# Patient Record
Sex: Male | Born: 1970 | Race: Black or African American | Hispanic: No | Marital: Married | Smoking: Former smoker
Health system: Southern US, Community
[De-identification: ages and names within clinical notes are randomized; demographics above are authoritative.]

## PROBLEM LIST (undated history)

## (undated) DIAGNOSIS — R7303 Prediabetes: Secondary | ICD-10-CM

## (undated) DIAGNOSIS — M1990 Unspecified osteoarthritis, unspecified site: Secondary | ICD-10-CM

## (undated) DIAGNOSIS — T7840XA Allergy, unspecified, initial encounter: Secondary | ICD-10-CM

## (undated) HISTORY — DX: Allergy, unspecified, initial encounter: T78.40XA

---

## 2003-11-21 HISTORY — PX: KNEE ARTHROSCOPY W/ PARTIAL MEDIAL MENISCECTOMY: SHX1882

## 2009-10-08 ENCOUNTER — Ambulatory Visit: Payer: Self-pay | Admitting: Family Medicine

## 2009-10-08 DIAGNOSIS — J45909 Unspecified asthma, uncomplicated: Secondary | ICD-10-CM | POA: Insufficient documentation

## 2009-10-08 DIAGNOSIS — L308 Other specified dermatitis: Secondary | ICD-10-CM | POA: Insufficient documentation

## 2009-10-08 DIAGNOSIS — J309 Allergic rhinitis, unspecified: Secondary | ICD-10-CM | POA: Insufficient documentation

## 2009-10-12 LAB — CONVERTED CEMR LAB
AST: 33 units/L (ref 0–37)
BUN: 14 mg/dL (ref 6–23)
CO2: 31 meq/L (ref 19–32)
Chloride: 100 meq/L (ref 96–112)
Creatinine, Ser: 1 mg/dL (ref 0.4–1.5)
Direct LDL: 139.8 mg/dL
Total Bilirubin: 1.5 mg/dL — ABNORMAL HIGH (ref 0.3–1.2)
Total CHOL/HDL Ratio: 5
Triglycerides: 243 mg/dL — ABNORMAL HIGH (ref 0.0–149.0)

## 2009-10-28 ENCOUNTER — Encounter: Payer: Self-pay | Admitting: Family Medicine

## 2010-10-10 ENCOUNTER — Telehealth (INDEPENDENT_AMBULATORY_CARE_PROVIDER_SITE_OTHER): Payer: Self-pay | Admitting: *Deleted

## 2010-10-18 ENCOUNTER — Ambulatory Visit: Payer: Self-pay | Admitting: Family Medicine

## 2010-10-21 LAB — CONVERTED CEMR LAB
CO2: 32 meq/L (ref 19–32)
Chloride: 102 meq/L (ref 96–112)
Creatinine, Ser: 1.1 mg/dL (ref 0.4–1.5)
Direct LDL: 125.7 mg/dL
HDL: 42.8 mg/dL (ref >39.00)
Total CHOL/HDL Ratio: 6
Triglycerides: 371 mg/dL — ABNORMAL HIGH (ref 0.0–149.0)
VLDL: 74.2 mg/dL — ABNORMAL HIGH (ref 0.0–40.0)

## 2010-10-25 ENCOUNTER — Ambulatory Visit: Payer: Self-pay | Admitting: Family Medicine

## 2010-10-25 DIAGNOSIS — E781 Pure hyperglyceridemia: Secondary | ICD-10-CM | POA: Insufficient documentation

## 2010-12-20 NOTE — Progress Notes (Signed)
----   Converted from flag ---- ---- 10/07/2010 12:59 PM, Ruthe Mannan MD wrote: BMET (v70.0), fasting lipid panel (v81.0)  ---- 10/07/2010 12:19 PM, Liane Comber CMA (AAMA) wrote: Lab orders please! Good Morning! This pt is scheduled for cpx labs Tuesday, which labs to draw and dx codes to use? Thanks Tasha ------------------------------

## 2010-12-20 NOTE — Assessment & Plan Note (Signed)
Summary: CPX/CLE  R/S FROM 10/20/10,10/19/10   Vital Signs:  Patient profile:   40 year old male Height:      70 inches Weight:      234.50 pounds BMI:     33.77 Temp:     98.8 degrees F oral Pulse rate:   80 / minute Pulse rhythm:   regular BP sitting:   130 / 90  (left arm) Cuff size:   large  Vitals Entered By: Linde Gillis CMA Duncan Dull) (October 25, 2010 8:00 AM) CC: complete physical   History of Present Illness: 40 yo pt here for CPX.  Doing very well.  Just got engaged and bought a new house!  Allergies/asthma- had severe allergies and asthma when he lived in a different apartment in [place] ( alot of mold), had to use daily advair and daily rescue inhaler.  Since he moved out of that home in 2005, has not used either inhaler at all.  No cough, no shortness of breath, no wheezing.   Elevated TG- TG 371 ( was 243 last year), otherwise lipid panel within normal limits for his age and risk factors. Admits to drinking more ETOH in last two months- his dad was livign with them.  Also a little less physically active. Wants to loose some weight as well.  Current Medications (verified): 1)  None  Allergies (verified): No Known Drug Allergies  Past History:  Past Medical History: Last updated: 10/08/2009 Allergic rhinitis Asthma  Past Surgical History: Last updated: 10/08/2009 left knee meniscal repair in 2005  Family History: Last updated: 10/08/2009 Mom - DM, diabetic retinopathy, HTN Dad- DM, HTN, CHF 4 healthy sisters.  Social History: Last updated: 10/08/2009 LIves in [place] with Girlfriend and 2 dogs.  works as an Acupuncturist.  Likes golf, basketball. Never Smoked Alcohol use-yes Drug use-no Regular exercise-yes  Risk Factors: Exercise: yes (10/08/2009)  Risk Factors: Smoking Status: never (10/08/2009)  Review of Systems      See HPI General:  Denies malaise. Eyes:  Denies blurring. ENT:  Denies difficulty swallowing. CV:  Denies  chest pain or discomfort. Resp:  Denies shortness of breath. GI:  Denies abdominal pain. GU:  Denies dysuria. MS:  Denies joint pain, joint redness, and joint swelling. Derm:  Denies rash. Neuro:  Denies headaches. Psych:  Denies anxiety and depression. Endo:  Denies cold intolerance and heat intolerance. Heme:  Denies abnormal bruising and bleeding.  Physical Exam  General:  Well-developed,well-nourished,in no acute distress; alert,appropriate and cooperative throughout examination Head:  normocephalic and atraumatic.   Eyes:  vision grossly intact, pupils equal, and pupils round.   Ears:  R ear normal and L ear normal.   Nose:  no external deformity.   Mouth:  good dentition.   Neck:  No deformities, masses, or tenderness noted. Lungs:  Normal respiratory effort, chest expands symmetrically. Lungs are clear to auscultation, no crackles or wheezes. Heart:  Normal rate and regular rhythm. S1 and S2 normal without gallop, murmur, click, rub or other extra sounds. Abdomen:  Bowel sounds positive,abdomen soft and non-tender without masses, organomegaly or hernias noted. Msk:  No deformity or scoliosis noted of thoracic or lumbar spine.   Extremities:  No clubbing, cyanosis, edema, or deformity noted with normal full range of motion of all joints.   Neurologic:  alert & oriented X3 and gait normal.   Skin:  very dry skin on hands, arms,  and legs, cracked, no erythema but improved from last year. Cervical Nodes:  No lymphadenopathy noted Psych:  normally interactive, good eye contact, not anxious appearing, and not depressed appearing.     Impression & Recommendations:  Problem # 1:  Preventive Health Care (ICD-V70.0) Reviewed preventive care protocols, scheduled due services, and updated immunizations Discussed nutrition, exercise, diet, and healthy lifestyle.  Problem # 2:  HYPERTRIGLYCERIDEMIA (ICD-272.1) Assessment: Deteriorated Will try lifestyle changes (see pt instructions).   He is aware that if it does not decrease in 8-12 weeks, we have to add medication.  Patient Instructions: 1)  Triglycerides are too high.  Weight loss (even small amount) can decrease triglycerides.  Decrease added sugars, eliminate trans fats, increase fiber and limit alcohol.  All these changes together can drop triglycerides by almost 50%. 2)  Let's recheck your fasting lipid panel in 8 -12 weeks- (272.4) 3)  Congratulations!   Orders Added: 1)  Est. Patient 18-39 years [99395]    Prior Medications (reviewed today): None Current Allergies (reviewed today): No known allergies

## 2010-12-23 ENCOUNTER — Other Ambulatory Visit: Payer: Self-pay | Admitting: Family Medicine

## 2010-12-23 ENCOUNTER — Encounter (INDEPENDENT_AMBULATORY_CARE_PROVIDER_SITE_OTHER): Payer: Self-pay | Admitting: *Deleted

## 2010-12-23 ENCOUNTER — Other Ambulatory Visit (INDEPENDENT_AMBULATORY_CARE_PROVIDER_SITE_OTHER): Payer: Managed Care, Other (non HMO)

## 2010-12-23 ENCOUNTER — Ambulatory Visit: Admit: 2010-12-23 | Payer: Self-pay | Admitting: Family Medicine

## 2010-12-23 DIAGNOSIS — E781 Pure hyperglyceridemia: Secondary | ICD-10-CM

## 2010-12-23 DIAGNOSIS — E785 Hyperlipidemia, unspecified: Secondary | ICD-10-CM

## 2011-02-13 ENCOUNTER — Other Ambulatory Visit: Payer: Self-pay | Admitting: Family Medicine

## 2011-02-13 DIAGNOSIS — E785 Hyperlipidemia, unspecified: Secondary | ICD-10-CM

## 2011-02-14 ENCOUNTER — Other Ambulatory Visit: Payer: Managed Care, Other (non HMO)

## 2011-02-17 ENCOUNTER — Other Ambulatory Visit (INDEPENDENT_AMBULATORY_CARE_PROVIDER_SITE_OTHER): Payer: Managed Care, Other (non HMO) | Admitting: Family Medicine

## 2011-02-17 DIAGNOSIS — E785 Hyperlipidemia, unspecified: Secondary | ICD-10-CM

## 2011-02-17 LAB — HEPATIC FUNCTION PANEL: Albumin: 4.3 g/dL (ref 3.5–5.2)

## 2011-02-17 LAB — LIPID PANEL
Cholesterol: 200 mg/dL (ref 0–200)
HDL: 42.6 mg/dL (ref >39.00)
Total CHOL/HDL Ratio: 5
Triglycerides: 265 mg/dL — ABNORMAL HIGH (ref 0.0–149.0)
VLDL: 53 mg/dL — ABNORMAL HIGH (ref 0.0–40.0)

## 2011-02-17 LAB — LDL CHOLESTEROL, DIRECT: Direct LDL: 108.9 mg/dL

## 2011-06-08 ENCOUNTER — Encounter: Payer: Self-pay | Admitting: Family Medicine

## 2011-06-09 ENCOUNTER — Ambulatory Visit (INDEPENDENT_AMBULATORY_CARE_PROVIDER_SITE_OTHER): Payer: Managed Care, Other (non HMO) | Admitting: Family Medicine

## 2011-06-09 ENCOUNTER — Encounter: Payer: Self-pay | Admitting: Family Medicine

## 2011-06-09 VITALS — BP 120/70 | HR 77 | Temp 98.6°F | Ht 70.5 in | Wt 229.8 lb

## 2011-06-09 DIAGNOSIS — J45909 Unspecified asthma, uncomplicated: Secondary | ICD-10-CM

## 2011-06-09 MED ORDER — FLUTICASONE-SALMETEROL 250-50 MCG/DOSE IN AEPB: 1.0000 | INHALATION_SPRAY | Freq: Two times a day (BID) | RESPIRATORY_TRACT | Status: DC

## 2011-06-09 MED ORDER — ALBUTEROL SULFATE HFA 108 (90 BASE) MCG/ACT IN AERS: 2.0000 | INHALATION_SPRAY | Freq: Four times a day (QID) | RESPIRATORY_TRACT | Status: DC | PRN

## 2011-06-09 MED ORDER — MONTELUKAST SODIUM 10 MG PO TABS: 10.0000 mg | ORAL_TABLET | Freq: Every day | ORAL | Status: DC

## 2011-06-09 NOTE — Progress Notes (Signed)
40 yo here for worsening asthma symptoms.  Allergies/asthma- had severe allergies and asthma when he lived in a different apartment in [place] ( alot of mold), had to use daily advair and daily rescue inhaler. Since he moved out of that home in 2005, has not used either inhaler at all and was fine until a couple of months ago  With hot weather and humidity, increased cough and wheezing.  Some mild nasal congestion.  No longer has any prescriptions for inhalers.   No CP, no SOB.     Patient Active Problem List  Diagnoses  . HYPERTRIGLYCERIDEMIA  . ALLERGIC RHINITIS  . ASTHMA  . ASTEATOTIC ECZEMA   Past Medical History  Diagnosis Date  . Allergy   . Asthma    Past Surgical History  Procedure Date  . Knee arthroscopy w/ meniscal repair 2005    left    History  Substance Use Topics  . Smoking status: Never Smoker   . Smokeless tobacco: Not on file  . Alcohol Use: Yes   Family History  Problem Relation Age of Onset  . Diabetes Mother   . Diabetes Father   . Hypertension Father     The PMH, PSH, Social History, Family History, Medications, and allergies have been reviewed in [facility], and have been updated if relevant.  Review of Systems  See HPI  General: Denies malaise.  Eyes: Denies blurring.  ENT: Denies difficulty swallowing.  CV: Denies chest pain or discomfort.  GU: Denies dysuria.  MS: Denies joint pain, joint redness, and joint swelling.  Derm: Denies rash.  Neuro: Denies headaches.  Psych: Denies anxiety and depression.  Endo: Denies cold intolerance and heat intolerance.  Heme: Denies abnormal bruising and bleeding.   Physical Exam  BP 120/70  Pulse 77  Temp(Src) 98.6 F (37 C) (Oral)  Ht 5' 10.5" (1.791 m)  Wt 229 lb 12.8 oz (104.237 kg)  BMI 32.51 kg/m2  SpO2 99%  General: Well-developed,well-nourished,in no acute distress; alert,appropriate and cooperative throughout examination  Head: normocephalic and atraumatic.  Eyes: vision grossly  intact, pupils equal, and pupils round.  Ears: R ear normal and L ear normal.  Nose: no external deformity, pos mild erythema.  Mouth: good dentition.  Neck: No deformities, masses, or tenderness noted.  Lungs: Normal respiratory effort, chest expands symmetrically.  Faint exp wheezes, bilaterally  Heart: Normal rate and regular rhythm. S1 and S2 normal without gallop, murmur, click, rub or other extra sounds.  Abdomen: Bowel sounds positive,abdomen soft and non-tender without masses, organomegaly or hernias noted.  Msk: No deformity or scoliosis noted of thoracic or lumbar spine.  Extremities: No clubbing, cyanosis, edema, or deformity noted with normal full range of motion of all joints.  Neurologic: alert & oriented X3 and gait normal.  Skin: very dry skin on hands, arms, and legs, cracked, no erythema but improved from last year.  Cervical Nodes: No lymphadenopathy noted  Psych: normally interactive, good eye contact, not anxious appearing, and not depressed appearing.   Assessment and Plan: 1. ASTHMA   Deteriorated. Will start Advair today, albuterol as needed. Add Singulair as well. The patient indicates understanding of these issues and agrees with the plan.

## 2011-08-31 ENCOUNTER — Other Ambulatory Visit: Payer: Self-pay | Admitting: Family Medicine

## 2011-09-18 ENCOUNTER — Other Ambulatory Visit: Payer: Self-pay | Admitting: Family Medicine

## 2011-11-06 ENCOUNTER — Telehealth: Payer: Self-pay | Admitting: *Deleted

## 2011-11-06 NOTE — Telephone Encounter (Signed)
Pt request Tetanus booster to go back to school at [employer]. Please advise.

## 2011-11-07 NOTE — Telephone Encounter (Signed)
Left message on cell phone voicemail advising patient to call back to schedule tetanus shot on nurse schedule.

## 2011-11-07 NOTE — Telephone Encounter (Signed)
Ok to schedule.

## 2011-11-15 ENCOUNTER — Ambulatory Visit: Payer: Managed Care, Other (non HMO)

## 2011-11-18 ENCOUNTER — Other Ambulatory Visit: Payer: Self-pay | Admitting: Family Medicine

## 2011-12-18 ENCOUNTER — Ambulatory Visit (INDEPENDENT_AMBULATORY_CARE_PROVIDER_SITE_OTHER)
Admission: RE | Admit: 2011-12-18 | Discharge: 2011-12-18 | Disposition: A | Discharge status: Home / Self Care | Payer: Managed Care, Other (non HMO) | Source: Ambulatory Visit | Attending: Family Medicine | Admitting: Family Medicine

## 2011-12-18 ENCOUNTER — Encounter: Payer: Self-pay | Admitting: Family Medicine

## 2011-12-18 ENCOUNTER — Ambulatory Visit (INDEPENDENT_AMBULATORY_CARE_PROVIDER_SITE_OTHER): Payer: Managed Care, Other (non HMO) | Admitting: Family Medicine

## 2011-12-18 VITALS — BP 122/74 | HR 85 | Temp 99.0°F | Ht 70.5 in | Wt 233.0 lb

## 2011-12-18 DIAGNOSIS — M25562 Pain in left knee: Secondary | ICD-10-CM

## 2011-12-18 DIAGNOSIS — M25569 Pain in unspecified knee: Secondary | ICD-10-CM

## 2011-12-18 NOTE — Patient Instructions (Signed)
[email or web address]  Google "[facility]" Videos

## 2011-12-18 NOTE — Progress Notes (Signed)
  Patient Name: Bradley Carroll Date of Birth: Feb 24, 1971 Age: 41 y.o. Medical Record Number: [id number] Gender: male Date of Encounter: 12/18/2011  History of Present Illness:  Bradley Carroll is a 41 y.o. very pleasant male patient who presents with the following:  2005 - Injured left knee and played some soccer. Tore meniscaus, and had a scope Did some rehab, bending, squatting. After over, pain, mostly on L  Patient presents with multi-year h/o L sided knee pain after no acute injury. No audible pop was heard. The patient has had an effusion. No symptomatic giving-way. Occ mechanical clicking. Joint has not locked up. Patient has been able to walk without limping. The patient does have pain going up and down stairs or rising from a seated position.   Pain location: diffuse Current physical activity: none Prior Knee Surgery: L arthroscopy, partial menisectomy Current pain meds: prn motrin Bracing: none Occupation or school level: Acupuncturist  Past Medical History, Surgical History, Social History, Family History, Problem List, Medications, and Allergies have been reviewed and updated if relevant.  Review of Systems:  GEN: No fevers, chills. Nontoxic. Primarily MSK c/o today. MSK: Detailed in the HPI GI: tolerating PO intake without difficulty Neuro: No numbness, parasthesias, or tingling associated. Otherwise the pertinent positives of the ROS are noted above.    Physical Examination: Filed Vitals:   12/18/11 1636  BP: 122/74  Pulse: 85  Temp: 99 F (37.2 C)  TempSrc: Oral  Height: 5' 10.5" (1.791 m)  Weight: 233 lb (105.688 kg)  SpO2: 99%   Body mass index is 32.96 kg/(m^2).   GEN: WDWN, NAD, Non-toxic, Alert & Oriented x 3 HEENT: Atraumatic, Normocephalic.  Ears and Nose: No external deformity. EXTR: No clubbing/cyanosis/edema NEURO: Normal gait.  PSYCH: Normally interactive. Conversant. Not depressed or anxious appearing.  Calm demeanor.   Knee:  L Gait:  Normal heel toe pattern ROM: 0-125 Effusion: mild Echymosis or edema: none Patellar tendon NT Painful PLICA: neg Patellar grind: negative Medial and lateral patellar facet loading: negative Mild crepitus medial and lateral joint lines:NT Mcmurray's neg Flexion-pinch neg Varus and valgus stress: stable Lachman: neg Ant and Post drawer: neg Hip abduction, IR, ER: WNL Hip flexion str: 5/5 Hip abd: 5/5 Quad: 5/5 VMO atrophy:No Hamstring concentric and eccentric: 5/5     Assessment and Plan: 1. Left knee pain  DG Knee Bilateral Standing AP, DG Knee 1-2 Views Left    X-rays: AP Bilateral Weight-bearing, Weightbearing Lateral, Sunrise views Indication: knee pain Findings:  Mild spurring, osteophyte formation. No acute fracture or dislocation  >25 minutes spent in face to face time with patient, >50% spent in counselling or coordination of care: reviewed anatomy and rehab videos Some underlying OA with occ effusions. No loose bodies seen.  Plan: work on losing weight, conditioning - leg, core Prn nsaids

## 2012-02-06 ENCOUNTER — Other Ambulatory Visit: Payer: Self-pay | Admitting: Family Medicine

## 2012-03-18 ENCOUNTER — Other Ambulatory Visit: Payer: Self-pay | Admitting: Family Medicine

## 2012-07-06 ENCOUNTER — Other Ambulatory Visit: Payer: Self-pay | Admitting: Family Medicine

## 2012-07-08 ENCOUNTER — Other Ambulatory Visit: Payer: Self-pay | Admitting: *Deleted

## 2012-07-08 MED ORDER — MONTELUKAST SODIUM 10 MG PO TABS: ORAL_TABLET | ORAL | Status: DC

## 2012-07-08 NOTE — Telephone Encounter (Signed)
Patient not seen in over 1 year for cpx is it okay to refill

## 2012-07-19 ENCOUNTER — Other Ambulatory Visit: Payer: Self-pay | Admitting: Family Medicine

## 2012-10-21 ENCOUNTER — Telehealth: Payer: Self-pay

## 2012-10-21 MED ORDER — BUDESONIDE-FORMOTEROL FUMARATE 160-4.5 MCG/ACT IN AERO: 2.0000 | INHALATION_SPRAY | Freq: Two times a day (BID) | RESPIRATORY_TRACT | Status: DC

## 2012-10-21 NOTE — Telephone Encounter (Signed)
Advised patient new script has been sent in.

## 2012-10-21 NOTE — Telephone Encounter (Signed)
Pt called insurance coverage changed and Advair is no longer covered; pt request change Advair to either dulera or symbicort to [facility] [address]Please advise.

## 2012-10-21 NOTE — Telephone Encounter (Signed)
Symbicort rx sent.

## 2012-11-27 ENCOUNTER — Encounter: Payer: Self-pay | Admitting: Family Medicine

## 2012-11-27 ENCOUNTER — Ambulatory Visit (INDEPENDENT_AMBULATORY_CARE_PROVIDER_SITE_OTHER): Payer: Managed Care, Other (non HMO) | Admitting: Family Medicine

## 2012-11-27 VITALS — BP 120/88 | HR 88 | Temp 98.4°F | Wt 225.0 lb

## 2012-11-27 DIAGNOSIS — Z23 Encounter for immunization: Secondary | ICD-10-CM

## 2012-11-27 DIAGNOSIS — G4700 Insomnia, unspecified: Secondary | ICD-10-CM

## 2012-11-27 LAB — COMPREHENSIVE METABOLIC PANEL
AST: 34 U/L (ref 0–37)
Albumin: 4.9 g/dL (ref 3.5–5.2)
Alkaline Phosphatase: 58 U/L (ref 39–117)
Potassium: 4.2 mEq/L (ref 3.5–5.1)
Sodium: 138 mEq/L (ref 135–145)
Total Bilirubin: 1.6 mg/dL — ABNORMAL HIGH (ref 0.3–1.2)
Total Protein: 8.2 g/dL (ref 6.0–8.3)

## 2012-11-27 LAB — CBC WITH DIFFERENTIAL/PLATELET
Basophils Relative: 0.5 % (ref 0.0–3.0)
Eosinophils Absolute: 0.1 10*3/uL (ref 0.0–0.7)
HCT: 51.1 % (ref 39.0–52.0)
Lymphs Abs: 1.4 10*3/uL (ref 0.7–4.0)
MCHC: 34 g/dL (ref 30.0–36.0)
MCV: 87.9 fl (ref 78.0–100.0)
Monocytes Absolute: 0.3 10*3/uL (ref 0.1–1.0)
Neutrophils Relative %: 52.4 % (ref 43.0–77.0)
RBC: 5.82 Mil/uL — ABNORMAL HIGH (ref 4.22–5.81)

## 2012-11-27 MED ORDER — TRAZODONE HCL 50 MG PO TABS: 25.0000 mg | ORAL_TABLET | Freq: Every evening | ORAL | Status: DC | PRN

## 2012-11-27 NOTE — Progress Notes (Signed)
  Subjective:    Patient ID: Bradley Carroll, male    DOB: [birth year], 42 y.o.   MRN: [id number]  HPI  42 yo male here to discuss insomnia.  Wife is [redacted] weeks pregnant with their first child (boy).  Past two months, he is having difficulty falling asleep and having vivid dreams. He can stay asleep once he is asleep. Denies feeling overtly anxious.  Very excited about the baby's arrival.  Denies any depression.  Has not tried any OTC meds.  Does not watch TV in bed.  Tries not to eat too late.  Patient Active Problem List  Diagnosis  . HYPERTRIGLYCERIDEMIA  . ALLERGIC RHINITIS  . ASTHMA  . ASTEATOTIC ECZEMA  . Insomnia   Past Medical History  Diagnosis Date  . Allergy   . Asthma    Past Surgical History  Procedure Date  . Knee arthroscopy w/ partial medial meniscectomy 2005   History  Substance Use Topics  . Smoking status: Never Smoker   . Smokeless tobacco: Not on file  . Alcohol Use: Yes   Family History  Problem Relation Age of Onset  . Diabetes Mother   . Diabetes Father   . Hypertension Father    No Known Allergies Current Outpatient Prescriptions on File Prior to Visit  Medication Sig Dispense Refill  . ammonium lactate (AMLACTIN) 12 % cream       . budesonide-formoterol (SYMBICORT) 160-4.5 MCG/ACT inhaler Inhale 2 puffs into the lungs 2 (two) times daily.  1 Inhaler  3  . montelukast (SINGULAIR) 10 MG tablet TAKE 1 TABLET AT BEDTIME  30 tablet  2  . PROAIR HFA 108 (90 BASE) MCG/ACT inhaler INHALE 2 PUFFS INTO THE LUNGS EVERY 6 (SIX) HOURS AS NEEDED FOR WHEEZING.  8.5 g  2  . traZODone (DESYREL) 50 MG tablet Take 0.5-1 tablets (25-50 mg total) by mouth at bedtime as needed for sleep.  30 tablet  3   The PMH, PSH, Social History, Family History, Medications, and allergies have been reviewed in [facility], and have been updated if relevant.   Review of Systems See HPI    Objective:   Physical Exam BP 120/88  Pulse 88  Temp 98.4 F (36.9 C)  Wt 225 lb  (102.059 kg) Gen:  Alert, pleasant, NAD Psych:  Good eye contact, not anxious or depressed appearing.    Assessment & Plan:   1. Insomnia  Comprehensive metabolic panel, CBC with Differential, TSH   >15 min spent with face to face with patient, >50% counseling and/or coordinating care.  Insomnia likely situational due to anticipation of baby's arrival. Will check labs to rule out any other contributing factors.  Start with OTC meds- see patient instructions.  Has good sleep hygeine. Given rx for trazodone to fill if symptoms do not improve.

## 2012-11-27 NOTE — Addendum Note (Signed)
Addended by: Eliezer Bottom on: 11/27/2012 10:57 AM   Modules accepted: Orders

## 2012-11-27 NOTE — Patient Instructions (Addendum)
Good to see you. Can't wait to meet little EDL4.  Let's try the over the counter meds first:  - benadryl - 1 tablet nightly OR tylenol PM  - melatonin  If after a few nights, this is working, go ahead and start the Trazodone.

## 2012-11-28 ENCOUNTER — Other Ambulatory Visit: Payer: Self-pay | Admitting: Family Medicine

## 2012-11-28 DIAGNOSIS — R7989 Other specified abnormal findings of blood chemistry: Secondary | ICD-10-CM

## 2012-12-03 ENCOUNTER — Other Ambulatory Visit: Payer: Managed Care, Other (non HMO)

## 2013-01-15 ENCOUNTER — Other Ambulatory Visit (INDEPENDENT_AMBULATORY_CARE_PROVIDER_SITE_OTHER): Payer: Managed Care, Other (non HMO)

## 2013-01-15 DIAGNOSIS — R946 Abnormal results of thyroid function studies: Secondary | ICD-10-CM

## 2013-01-15 DIAGNOSIS — R7989 Other specified abnormal findings of blood chemistry: Secondary | ICD-10-CM

## 2013-01-15 LAB — CBC WITH DIFFERENTIAL/PLATELET
Basophils Absolute: 0 10*3/uL (ref 0.0–0.1)
Eosinophils Absolute: 0.1 10*3/uL (ref 0.0–0.7)
Eosinophils Relative: 1.8 % (ref 0.0–5.0)
HCT: 46.8 % (ref 39.0–52.0)
Lymphs Abs: 1.5 10*3/uL (ref 0.7–4.0)
MCHC: 34.2 g/dL (ref 30.0–36.0)
MCV: 86.8 fl (ref 78.0–100.0)
Monocytes Absolute: 0.3 10*3/uL (ref 0.1–1.0)
Neutrophils Relative %: 56 % (ref 43.0–77.0)
Platelets: 230 10*3/uL (ref 150.0–400.0)
RDW: 13.4 % (ref 11.5–14.6)
WBC: 4.3 10*3/uL — ABNORMAL LOW (ref 4.5–10.5)

## 2013-01-15 LAB — TSH: TSH: 0.38 u[IU]/mL (ref 0.35–5.50)

## 2013-01-16 LAB — PATHOLOGIST SMEAR REVIEW

## 2013-01-21 ENCOUNTER — Encounter: Payer: Managed Care, Other (non HMO) | Admitting: Family Medicine

## 2013-01-28 ENCOUNTER — Ambulatory Visit: Payer: Managed Care, Other (non HMO) | Admitting: Family Medicine

## 2013-01-28 NOTE — Progress Notes (Deleted)
Previous result note error. Will discuss with pt at office visit.

## 2013-01-28 NOTE — Progress Notes (Signed)
No show

## 2013-02-18 IMAGING — CR DG KNEE 1-2V*L*
4 series · 4 of 4 positions shown · non-contrast
Comparison: None available.

CLINICAL DATA: Left knee pain.  719.46.

LEFT KNEE - 1-2 VIEW

[view not recorded (1 of 4)]
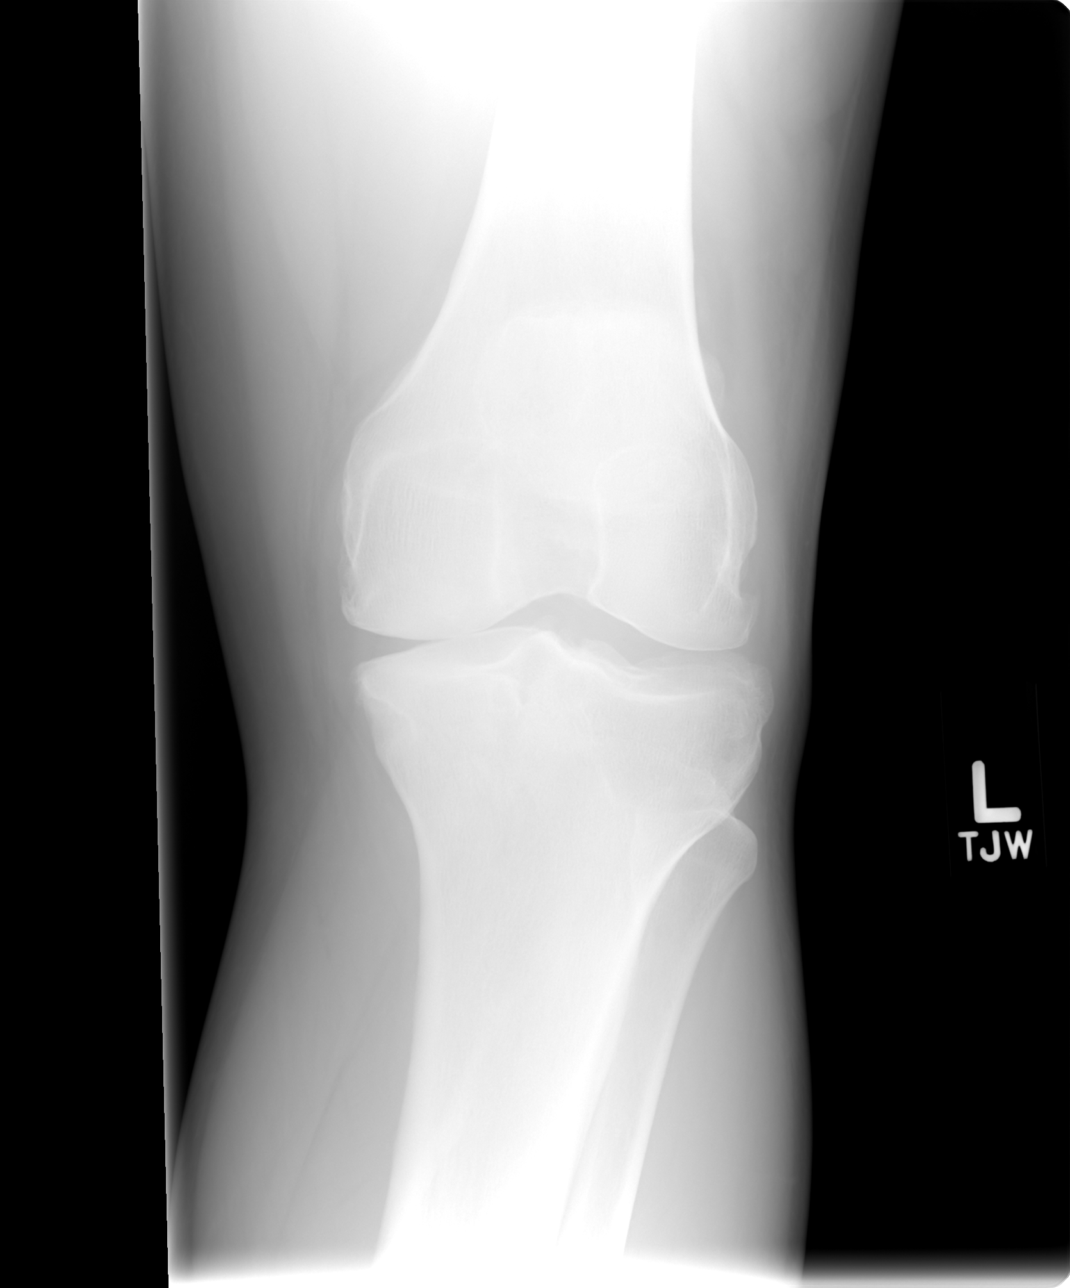

[view not recorded (2 of 4)]
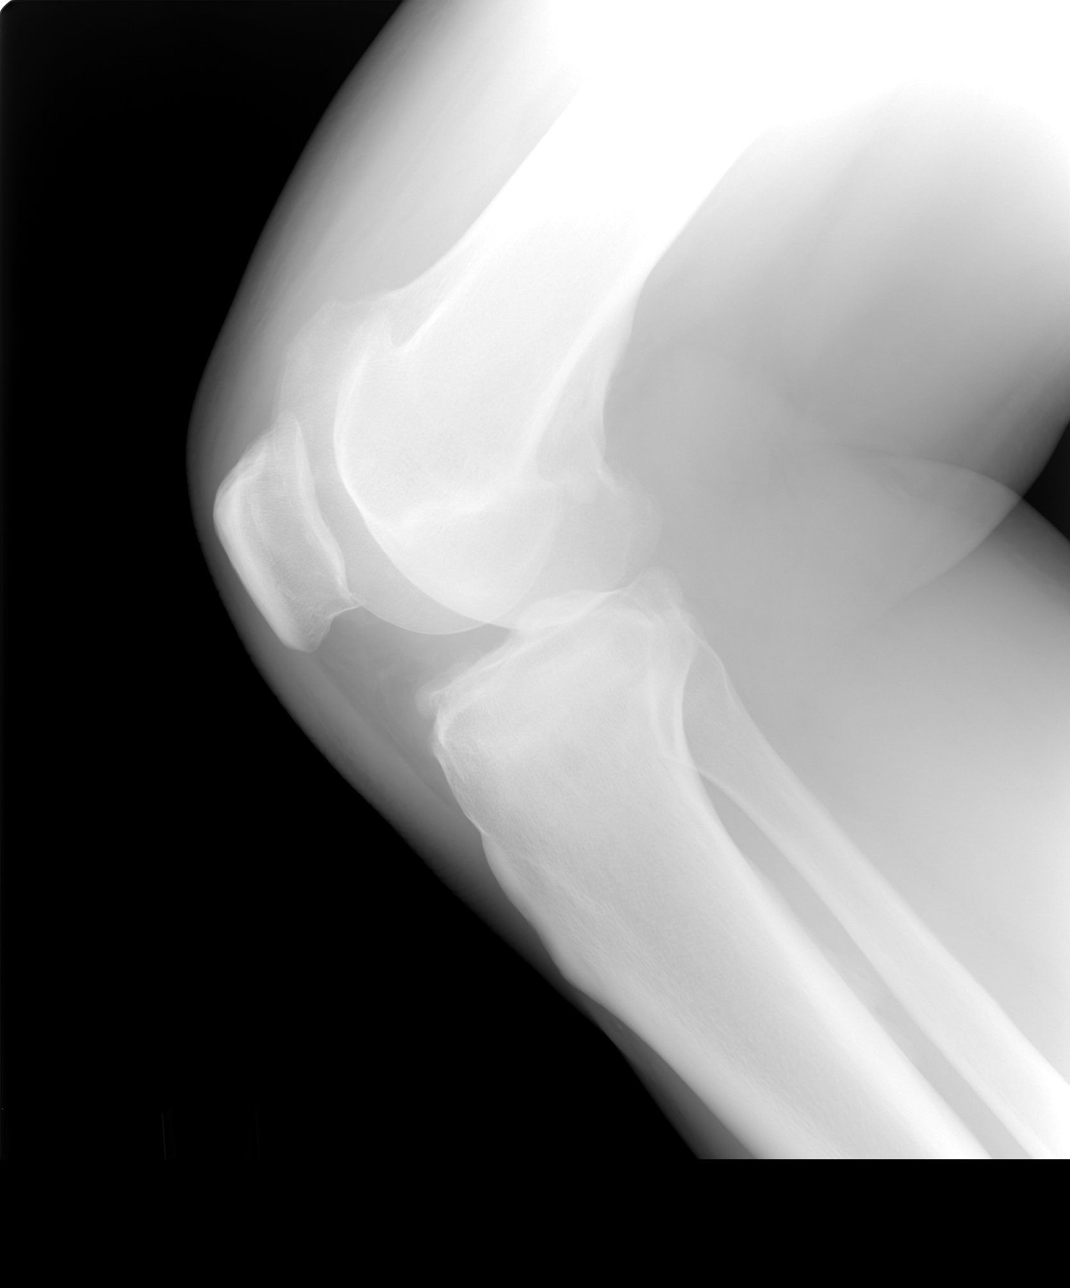

[view not recorded (3 of 4)]
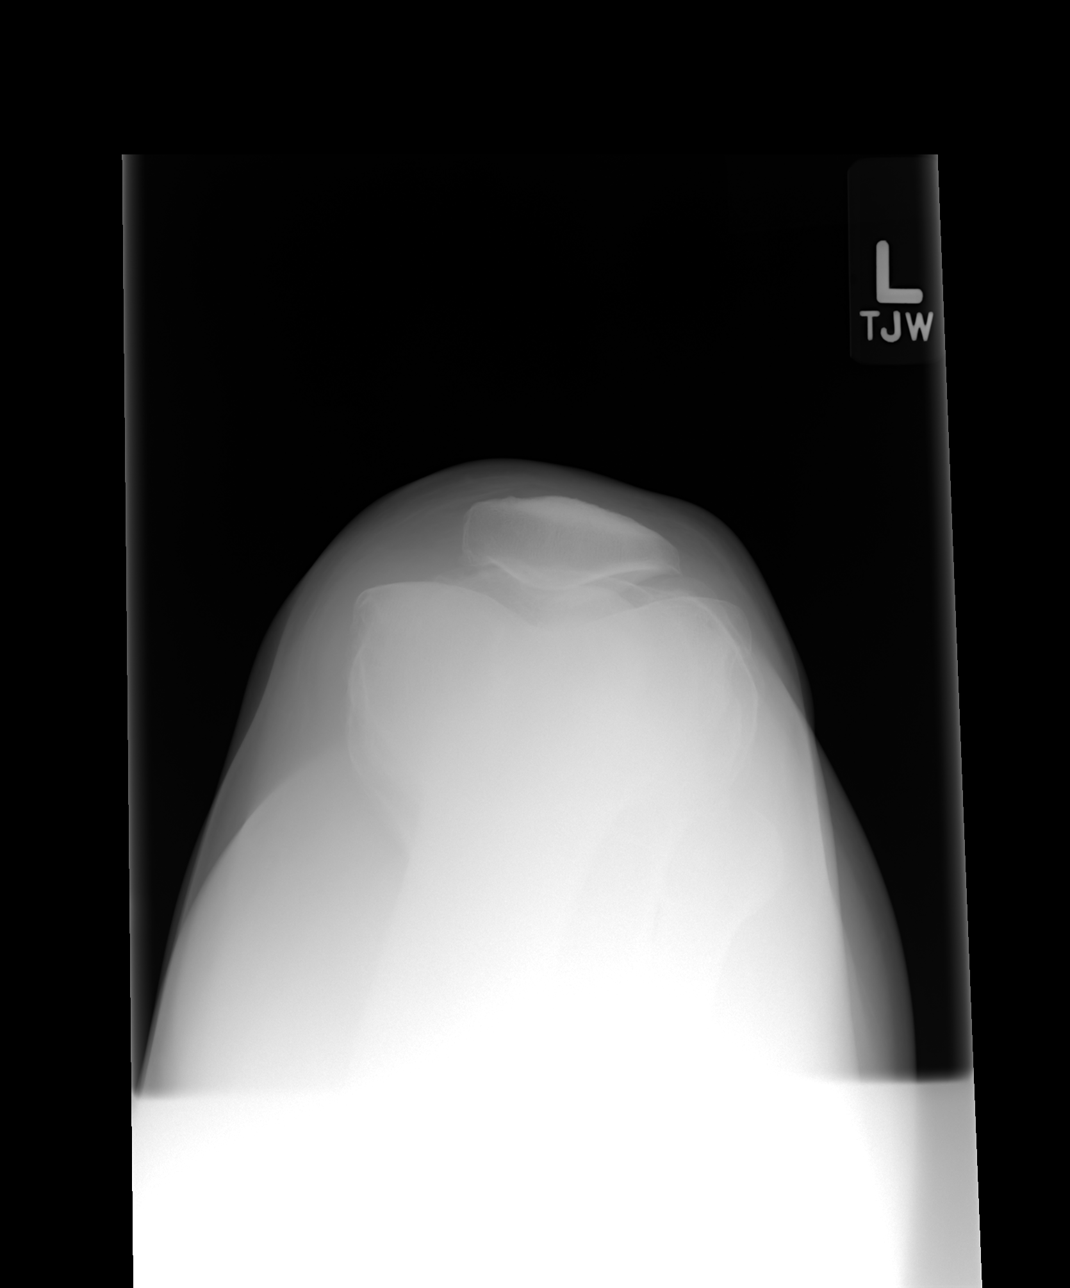

[view not recorded (4 of 4)]
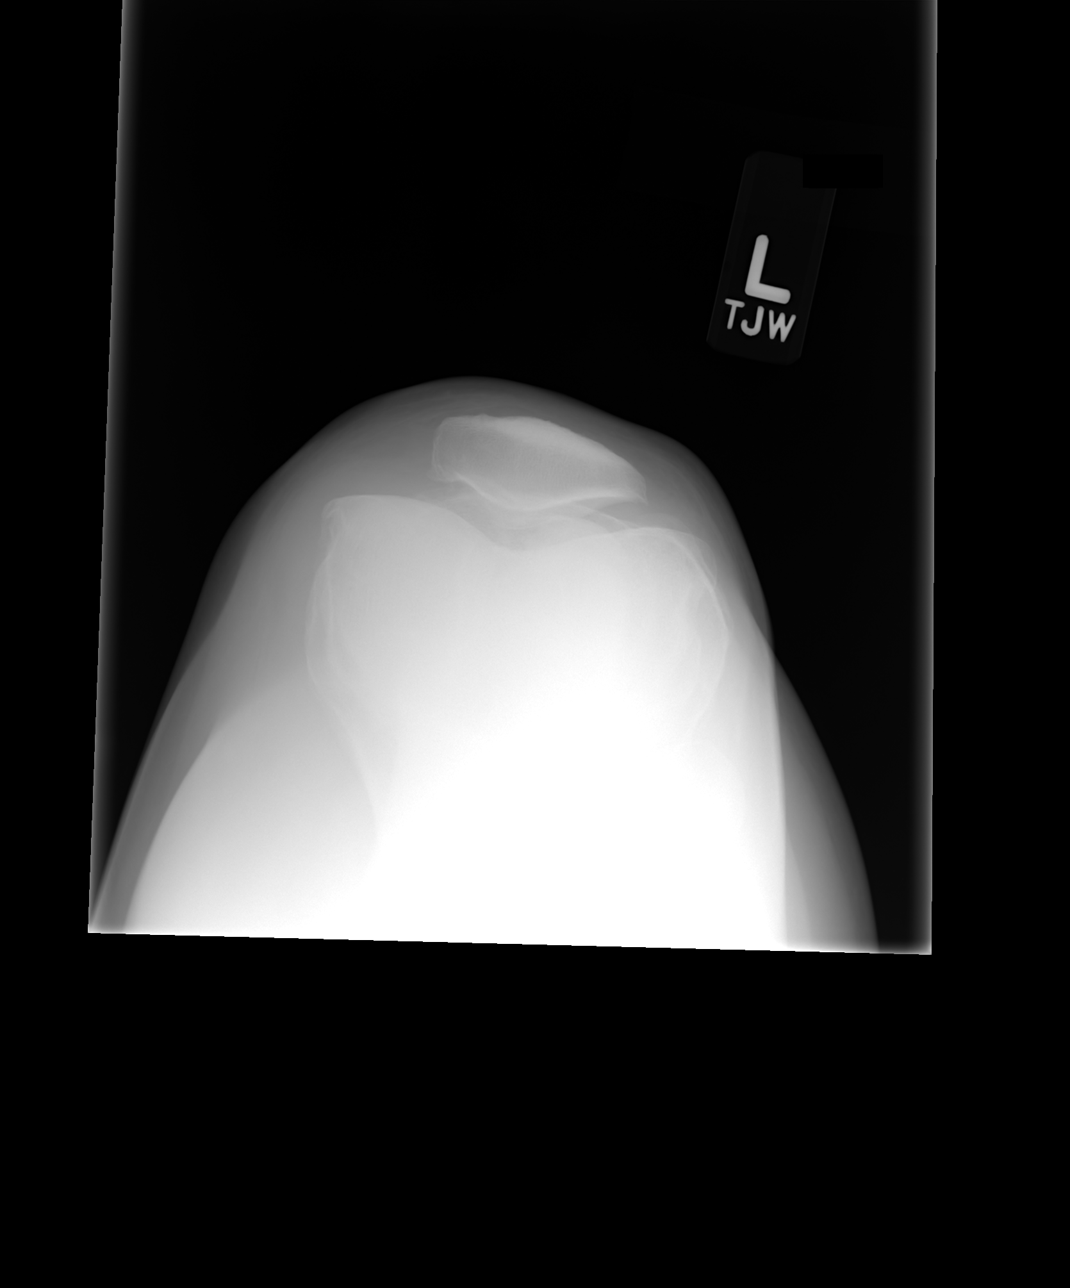

[4 of 4 positions shown; findings below may reference images not displayed]

FINDINGS: The left knee is located.  Mild tricompartment
degenerative changes are evident with some osteophyte formation
evident.  There is no significant effusion.  The joint spaces are
relatively maintained.
IMPRESSION: Mild tricompartment degenerative change with relative preservation
of the joint spaces.

## 2013-04-07 ENCOUNTER — Other Ambulatory Visit: Payer: Self-pay

## 2013-04-07 MED ORDER — AMMONIUM LACTATE 12 % EX CREA: TOPICAL_CREAM | CUTANEOUS | Status: DC | PRN

## 2013-04-07 NOTE — Telephone Encounter (Signed)
Pt request refill ammonium lactate cream to [facility]. Pt has seen dermatologist before and is not sure who usually prescribes med.Please advise.

## 2013-04-11 ENCOUNTER — Other Ambulatory Visit: Payer: Self-pay

## 2013-04-11 MED ORDER — BUDESONIDE-FORMOTEROL FUMARATE 160-4.5 MCG/ACT IN AERO: 2.0000 | INHALATION_SPRAY | Freq: Two times a day (BID) | RESPIRATORY_TRACT | Status: DC

## 2013-04-11 NOTE — Telephone Encounter (Signed)
Pt request refill symbicort to [facility]; pt not having problem but wants to have available when needed. Pt advised refill sent.

## 2013-07-24 ENCOUNTER — Encounter: Payer: Self-pay | Admitting: Internal Medicine

## 2013-07-24 ENCOUNTER — Ambulatory Visit (INDEPENDENT_AMBULATORY_CARE_PROVIDER_SITE_OTHER): Payer: Managed Care, Other (non HMO) | Admitting: Internal Medicine

## 2013-07-24 VITALS — BP 120/84 | HR 98 | Temp 98.1°F | Wt 233.0 lb

## 2013-07-24 DIAGNOSIS — J309 Allergic rhinitis, unspecified: Secondary | ICD-10-CM

## 2013-07-24 DIAGNOSIS — J029 Acute pharyngitis, unspecified: Secondary | ICD-10-CM

## 2013-07-24 MED ORDER — METHYLPREDNISOLONE ACETATE 40 MG/ML IJ SUSP
40.0000 mg | Freq: Once | INTRAMUSCULAR | Status: AC
  Administered 2013-07-24: 40 mg via INTRAMUSCULAR

## 2013-07-24 NOTE — Addendum Note (Signed)
Addended by: Eliezer Bottom on: 07/24/2013 02:42 PM   Modules accepted: Orders

## 2013-07-24 NOTE — Patient Instructions (Signed)
Allergic Rhinitis Allergic rhinitis is when the mucous membranes in the nose respond to allergens. Allergens are particles in the air that cause your body to have an allergic reaction. This causes you to release allergic antibodies. Through a chain of events, these eventually cause you to release histamine into the blood stream (hence the use of antihistamines). Although meant to be protective to the body, it is this release that causes your discomfort, such as frequent sneezing, congestion and an itchy runny nose.  CAUSES  The pollen allergens may come from grasses, trees, and weeds. This is seasonal allergic rhinitis, or "hay fever." Other allergens cause year-round allergic rhinitis (perennial allergic rhinitis) such as house dust mite allergen, pet dander and mold spores.  SYMPTOMS   Nasal stuffiness (congestion).  Runny, itchy nose with sneezing and tearing of the eyes.  There is often an itching of the mouth, eyes and ears. It cannot be cured, but it can be controlled with medications. DIAGNOSIS  If you are unable to determine the offending allergen, skin or blood testing may find it. TREATMENT   Avoid the allergen.  Medications and allergy shots (immunotherapy) can help.  Hay fever may often be treated with antihistamines in pill or nasal spray forms. Antihistamines block the effects of histamine. There are over-the-counter medicines that may help with nasal congestion and swelling around the eyes. Check with your caregiver before taking or giving this medicine. If the treatment above does not work, there are many new medications your caregiver can prescribe. Stronger medications may be used if initial measures are ineffective. Desensitizing injections can be used if medications and avoidance fails. Desensitization is when a patient is given ongoing shots until the body becomes less sensitive to the allergen. Make sure you follow up with your caregiver if problems continue. SEEK MEDICAL  CARE IF:   You develop fever (more than 100.5 F (38.1 C).  You develop a cough that does not stop easily (persistent).  You have shortness of breath.  You start wheezing.  Symptoms interfere with normal daily activities. Document Released: 08/01/2001 Document Revised: 01/29/2012 Document Reviewed: 02/10/2009 ExitCare Patient Information 2014 [employer].  

## 2013-07-24 NOTE — Progress Notes (Signed)
Subjective:    Patient ID: Bradley Carroll, male    DOB: December 11, 1970, 42 y.o.   MRN: [id number]  HPI  Pt presents to the clinic today with c/o a sore throat. This started back in July after staying at a friends house who had a cat. He is not allergic to cats that he is aware of but he has had that problem around cats before. He has had some nasal congestion and post nasal drip. He does have a history of allergies and asthma. He is not taking anything for allergies. He is on symbicort, albuterol and singulair. He denies fever, chills or body aches.  Review of Systems      Past Medical History  Diagnosis Date  . Allergy   . Asthma     Current Outpatient Prescriptions  Medication Sig Dispense Refill  . ammonium lactate (AMLACTIN) 12 % cream Apply topically as needed for dry skin.  385 g  0  . budesonide-formoterol (SYMBICORT) 160-4.5 MCG/ACT inhaler Inhale 2 puffs into the lungs 2 (two) times daily.  1 Inhaler  3  . montelukast (SINGULAIR) 10 MG tablet TAKE 1 TABLET AT BEDTIME  30 tablet  2  . PROAIR HFA 108 (90 BASE) MCG/ACT inhaler INHALE 2 PUFFS INTO THE LUNGS EVERY 6 (SIX) HOURS AS NEEDED FOR WHEEZING.  8.5 g  2  . traZODone (DESYREL) 50 MG tablet Take 0.5-1 tablets (25-50 mg total) by mouth at bedtime as needed for sleep.  30 tablet  3   No current facility-administered medications for this visit.    No Known Allergies  Family History  Problem Relation Age of Onset  . Diabetes Mother   . Diabetes Father   . Hypertension Father     History   Social History  . Marital Status: Single    Spouse Name: N/A    Number of Children: N/A  . Years of Education: N/A   Occupational History  . Not on file.   Social History Main Topics  . Smoking status: Never Smoker   . Smokeless tobacco: Not on file  . Alcohol Use: Yes  . Drug Use: No  . Sexual Activity: Not on file   Other Topics Concern  . Not on file   Social History Narrative   Lives in [place] with girlfriend and 2  dogs.  Works as Acupuncturist.  Likes golf, basketball     Constitutional: Denies fever, malaise, fatigue, headache or abrupt weight changes.  HEENT: Pt reports nasal congestion and sore throat. Denies eye pain, eye redness, ear pain, ringing in the ears, wax buildup, runny nose, bloody nose. Respiratory: Denies difficulty breathing, shortness of breath, cough or sputum production.   Skin: Denies redness, rashes, lesions or ulcercations.  Neurological: Denies dizziness, difficulty with memory, difficulty with speech or problems with balance and coordination.   No other specific complaints in a complete review of systems (except as listed in HPI above).  Objective:   Physical Exam   BP 120/84  Pulse 98  Temp(Src) 98.1 F (36.7 C)  Wt 233 lb (105.688 kg)  BMI 32.95 kg/m2 Wt Readings from Last 3 Encounters:  07/24/13 233 lb (105.688 kg)  11/27/12 225 lb (102.059 kg)  12/18/11 233 lb (105.688 kg)    General: Appears his stated age, well developed, well nourished in NAD. Skin: Warm, dry and intact. No rashes, lesions or ulcerations noted. HEENT: Head: normal shape and size; Eyes: sclera white, no icterus, conjunctiva pink, PERRLA and EOMs intact; Ears: Tm's red  but intact, normal light reflex; Nose: mucosa boggy and moist, septum midline; Throat/Mouth: Teeth present, mucosa erythematous and moist, + PND, no exudate, lesions or ulcerations noted.  Neck: Normal range of motion. Neck supple, trachea midline. No massses, lumps or thyromegaly present.  Cardiovascular: Normal rate and rhythm. S1,S2 noted.  No murmur, rubs or gallops noted. No JVD or BLE edema. No carotid bruits noted. Pulmonary/Chest: Normal effort and positive vesicular breath sounds. No respiratory distress. No wheezes, rales or ronchi noted.   BMET    Component Value Date/Time   NA 138 11/27/2012 1114   K 4.2 11/27/2012 1114   CL 102 11/27/2012 1114   CO2 29 11/27/2012 1114   GLUCOSE 84 11/27/2012 1114   BUN 19  11/27/2012 1114   CREATININE 1.3 11/27/2012 1114   CALCIUM 9.6 11/27/2012 1114   GFRNONAA 101.89 10/18/2010 0907    Lipid Panel     Component Value Date/Time   CHOL 200 02/17/2011 0948   TRIG 265.0* 02/17/2011 0948   HDL 42.60 02/17/2011 0948   CHOLHDL 5 02/17/2011 0948   VLDL 53.0* 02/17/2011 0948    CBC    Component Value Date/Time   WBC 4.3* 01/15/2013 0815   RBC 5.39 01/15/2013 0815   HGB 16.0 01/15/2013 0815   HCT 46.8 01/15/2013 0815   PLT 230.0 01/15/2013 0815   MCV 86.8 01/15/2013 0815   MCHC 34.2 01/15/2013 0815   RDW 13.4 01/15/2013 0815   LYMPHSABS 1.5 01/15/2013 0815   MONOABS 0.3 01/15/2013 0815   EOSABS 0.1 01/15/2013 0815   BASOSABS 0.0 01/15/2013 0815    Hgb A1C No results found for this basename: HGBA1C        Assessment & Plan:   Acute pharyngitis secondary to allergic rhinitis:  Will give 80 mg Depo IM today for sore throat Already on Singulair but advised pt to get Zytrec OTC If issues with allergies persist- consider allergy testing  RTC as needed or if symptoms persist or worsen

## 2013-07-28 ENCOUNTER — Telehealth: Payer: Self-pay

## 2013-07-28 MED ORDER — CETIRIZINE HCL 10 MG PO TABS: 10.0000 mg | ORAL_TABLET | Freq: Every day | ORAL | Status: DC

## 2013-07-28 NOTE — Telephone Encounter (Signed)
Rx sent 

## 2013-07-28 NOTE — Telephone Encounter (Signed)
Pt was seen 07/24/13 and was advised to get OTC Zyrtec. Pt said 2 weeks of OTC Zyrtec cost $16.99 and if called in as a prescription cost to pt would be $ 3.00 for 2 month. Pt request prescription of Zyrtec sent to  [facility] in [place].Please advise.

## 2013-07-31 ENCOUNTER — Ambulatory Visit (INDEPENDENT_AMBULATORY_CARE_PROVIDER_SITE_OTHER): Payer: Managed Care, Other (non HMO) | Admitting: Family Medicine

## 2013-07-31 ENCOUNTER — Encounter: Payer: Self-pay | Admitting: *Deleted

## 2013-07-31 ENCOUNTER — Encounter: Payer: Self-pay | Admitting: Family Medicine

## 2013-07-31 VITALS — BP 120/84 | HR 88 | Temp 98.0°F | Ht 70.0 in | Wt 226.0 lb

## 2013-07-31 DIAGNOSIS — Z125 Encounter for screening for malignant neoplasm of prostate: Secondary | ICD-10-CM

## 2013-07-31 DIAGNOSIS — G4700 Insomnia, unspecified: Secondary | ICD-10-CM

## 2013-07-31 DIAGNOSIS — E781 Pure hyperglyceridemia: Secondary | ICD-10-CM

## 2013-07-31 DIAGNOSIS — Z23 Encounter for immunization: Secondary | ICD-10-CM

## 2013-07-31 DIAGNOSIS — Z0000 Encounter for general adult medical examination without abnormal findings: Secondary | ICD-10-CM

## 2013-07-31 LAB — COMPREHENSIVE METABOLIC PANEL
Alkaline Phosphatase: 68 U/L (ref 39–117)
BUN: 20 mg/dL (ref 6–23)
CO2: 29 mEq/L (ref 19–32)
Creatinine, Ser: 0.9 mg/dL (ref 0.4–1.5)
GFR: 116.97 mL/min (ref >60.00)
Glucose, Bld: 168 mg/dL — ABNORMAL HIGH (ref 70–99)
Total Bilirubin: 1.1 mg/dL (ref 0.3–1.2)
Total Protein: 7.9 g/dL (ref 6.0–8.3)

## 2013-07-31 NOTE — Progress Notes (Signed)
Subjective:    Patient ID: Bradley Carroll, male    DOB: 12-29-1970, 42 y.o.   MRN: [id number]  HPI  42 yo here for CPX.  Doing well.  Son is now 55 months old and his wife just found out she is pregnant again.  He loves being a father and coping with it well.  Not having to take Trazodone often for insomnia.  Lab Results  Component Value Date   CHOL 200 02/17/2011   HDL 42.60 02/17/2011   LDLDIRECT 108.9 02/17/2011   TRIG 265.0* 02/17/2011   CHOLHDL 5 02/17/2011    No family h/o prostate CA.  He denies any difficulty urinating or sexual issues.  No changes in bowel habits.  Patient Active Problem List   Diagnosis Date Noted  . Routine general medical examination at a health care facility 01/28/2013  . Insomnia 11/27/2012  . HYPERTRIGLYCERIDEMIA 10/25/2010  . ALLERGIC RHINITIS 10/08/2009  . ASTHMA 10/08/2009  . ASTEATOTIC ECZEMA 10/08/2009   Past Medical History  Diagnosis Date  . Allergy   . Asthma    Past Surgical History  Procedure Laterality Date  . Knee arthroscopy w/ partial medial meniscectomy  2005   History  Substance Use Topics  . Smoking status: Never Smoker   . Smokeless tobacco: Not on file  . Alcohol Use: Yes   Family History  Problem Relation Age of Onset  . Diabetes Mother   . Diabetes Father   . Hypertension Father    No Known Allergies Current Outpatient Prescriptions on File Prior to Visit  Medication Sig Dispense Refill  . ammonium lactate (AMLACTIN) 12 % cream Apply topically as needed for dry skin.  385 g  0  . budesonide-formoterol (SYMBICORT) 160-4.5 MCG/ACT inhaler Inhale 2 puffs into the lungs 2 (two) times daily.  1 Inhaler  3  . cetirizine (ZYRTEC) 10 MG tablet Take 1 tablet (10 mg total) by mouth daily.  30 tablet  11  . montelukast (SINGULAIR) 10 MG tablet TAKE 1 TABLET AT BEDTIME  30 tablet  2  . PROAIR HFA 108 (90 BASE) MCG/ACT inhaler INHALE 2 PUFFS INTO THE LUNGS EVERY 6 (SIX) HOURS AS NEEDED FOR WHEEZING.  8.5 g  2  . traZODone  (DESYREL) 50 MG tablet Take 0.5-1 tablets (25-50 mg total) by mouth at bedtime as needed for sleep.  30 tablet  3   No current facility-administered medications on file prior to visit.   The PMH, PSH, Social History, Family History, Medications, and allergies have been reviewed in [facility], and have been updated if relevant.   Review of Systems See HPI    Patient reports no  vision/ hearing changes,anorexia, weight change, fever ,adenopathy, persistant / recurrent hoarseness, swallowing issues, chest pain, edema,persistant / recurrent cough, hemoptysis, dyspnea(rest, exertional, paroxysmal nocturnal), gastrointestinal  bleeding (melena, rectal bleeding), abdominal pain, excessive heart burn, GU symptoms(dysuria, hematuria, pyuria, voiding/incontinence  Issues) syncope, focal weakness, severe memory loss, concerning skin lesions, depression, anxiety, abnormal bruising/bleeding, major joint swelling.    Objective:   Physical Exam BP 120/84  Pulse 88  Temp(Src) 98 F (36.7 C)  Ht 5\' 10"  (1.778 m)  Wt 226 lb (102.513 kg)  BMI 32.43 kg/m2  General:  pleasant male in NAD Eyes:  PERRL Ears:  External ear exam shows no significant lesions or deformities.  Otoscopic examination reveals clear canals, tympanic membranes are intact bilaterally without bulging, retraction, inflammation or discharge. Hearing is grossly normal bilaterally. Nose:  External nasal examination shows no deformity or inflammation.  Nasal mucosa are pink and moist without lesions or exudates. Mouth:  Oral mucosa and oropharynx without lesions or exudates.  Teeth in good repair. Neck:  no carotid bruit or thyromegaly no cervical or supraclavicular lymphadenopathy  Lungs:  Normal respiratory effort, chest expands symmetrically. Lungs are clear to auscultation, no crackles or wheezes. Heart:  Normal rate and regular rhythm. S1 and S2 normal without gallop, murmur, click, rub or other extra sounds. Abdomen:  Bowel sounds  positive,abdomen soft and non-tender without masses, organomegaly or hernias noted. Pulses:  R and L posterior tibial pulses are full and equal bilaterally  Extremities:  no edema   Assessment & Plan:   1. Routine general medical examination at a health care facility Reviewed preventive care protocols, scheduled due services, and updated immunizations Discussed nutrition, exercise, diet, and healthy lifestyle.  - Comprehensive metabolic panel Flu shot today  2. Insomnia Improved since his son has arrived.  Rarely using trazadone.   3. Need for prophylactic vaccination and inoculation against influenza  - Flu Vaccine QUAD 36+ mos PF IM (Fluarix)  4. Screening for prostate cancer  - PSA

## 2013-07-31 NOTE — Patient Instructions (Addendum)
Good to see you. Congratulations!!!!  Keep in touch and we will call you with your lab results.

## 2013-08-15 ENCOUNTER — Other Ambulatory Visit: Payer: Self-pay | Admitting: *Deleted

## 2013-08-15 MED ORDER — BUDESONIDE-FORMOTEROL FUMARATE 160-4.5 MCG/ACT IN AERO: 2.0000 | INHALATION_SPRAY | Freq: Two times a day (BID) | RESPIRATORY_TRACT | Status: DC

## 2013-09-09 ENCOUNTER — Other Ambulatory Visit: Payer: Self-pay | Admitting: Family Medicine

## 2013-09-09 NOTE — Telephone Encounter (Signed)
Last OV 07/31/2013.  Ok to refill?

## 2013-09-25 ENCOUNTER — Telehealth: Payer: Self-pay | Admitting: Family Medicine

## 2013-09-25 NOTE — Telephone Encounter (Signed)
Pt requests to please send a copy of his immunization record to [facility], Admissions Office, [address] [place]  , [place], [place] [address].  If this is not possible, please print same and call pt when ready to pick up.

## 2013-09-26 NOTE — Telephone Encounter (Signed)
Spoke with patient and he will get a fax# and call back

## 2013-09-26 NOTE — Telephone Encounter (Signed)
Dee, can you help with this?

## 2013-10-07 ENCOUNTER — Other Ambulatory Visit: Payer: Self-pay | Admitting: Family Medicine

## 2013-10-09 ENCOUNTER — Telehealth: Payer: Self-pay

## 2013-10-09 NOTE — Telephone Encounter (Signed)
Pt scheduled appt with Dr. Reece Agar tomorrow morning, needed AM appt

## 2013-10-09 NOTE — Telephone Encounter (Signed)
He can try warm compresses TID. Let him know we do not call in abx without pts being seen

## 2013-10-09 NOTE — Telephone Encounter (Signed)
Pt left v/m that son was dx with pink eye 2 weeks ago;pt said started with symptoms on 10/08/13 and pt knows he has pink eye; both eyes is red, had crust on eye this AM but not itchy or draining.; pt does not want to come for appt and request med sent to [facility].pt request cb.

## 2013-10-10 ENCOUNTER — Encounter: Payer: Self-pay | Admitting: Family Medicine

## 2013-10-10 ENCOUNTER — Ambulatory Visit (INDEPENDENT_AMBULATORY_CARE_PROVIDER_SITE_OTHER): Payer: Managed Care, Other (non HMO) | Admitting: Family Medicine

## 2013-10-10 VITALS — BP 124/90 | HR 92 | Temp 98.5°F | Wt 234.2 lb

## 2013-10-10 DIAGNOSIS — H10023 Other mucopurulent conjunctivitis, bilateral: Secondary | ICD-10-CM | POA: Insufficient documentation

## 2013-10-10 DIAGNOSIS — H10029 Other mucopurulent conjunctivitis, unspecified eye: Secondary | ICD-10-CM

## 2013-10-10 MED ORDER — ERYTHROMYCIN 5 MG/GM OP OINT: 1.0000 "application " | TOPICAL_OINTMENT | Freq: Three times a day (TID) | OPHTHALMIC | Status: DC

## 2013-10-10 NOTE — Progress Notes (Signed)
  Subjective:    Patient ID: Bradley Carroll, male    DOB: 17-Feb-1971, 42 y.o.   MRN: [id number]  HPI CC: R conjunctivitis  1 wk h/o matted eyelids, then 2-3d h/o redness of bilateral eyes R>L.  + blurred vision from matting, not significantly tender or itchy. Self treated with son's medicine (see below).  Sick contacts - pink eye at son's daycare - and son with pinkeye 2 wks ago.  Son treated with antibiotic drops.  Denies recent cold sxs like rhinorrhea, fever/chills, no eye pain, no trouble with eye movement.  No residual vision changes. + nasal congestion.  Past Medical History  Diagnosis Date  . Allergy   . Asthma      Review of Systems Per HPI    Objective:   Physical Exam  Nursing note and vitals reviewed. Constitutional: He appears well-developed and well-nourished. No distress.  HENT:  Head: Normocephalic and atraumatic.  Eyes: EOM are normal. Pupils are equal, round, and reactive to light. Right eye exhibits no discharge. Left eye exhibits no discharge. Right conjunctiva is injected. Left conjunctiva is injected. No scleral icterus.  Bulbar and palpebral conjunctival injection with limbic sparing EOMI without pain.       Assessment & Plan:

## 2013-10-10 NOTE — Assessment & Plan Note (Signed)
Anticipate viral conjunctivitis - discussed this. Supportive care as per instructions.  Provided with WASP for romycin but discussed likely will not need. Update if not improving as expected.

## 2013-10-10 NOTE — Progress Notes (Signed)
Pre-visit discussion using our clinic review tool. No additional management support is needed unless otherwise documented below in the visit note.  

## 2013-10-10 NOTE — Patient Instructions (Signed)
You do have pink eye. Treat with cold compresses and lubricating eye drops if needed (like rephresh or hypotears). This should get better with time.. If worsening discharge, fill antibiotic ointment provided today.  Viral Conjunctivitis Conjunctivitis is an irritation (inflammation) of the clear membrane that covers the white part of the eye (the conjunctiva). The irritation can also happen on the underside of the eyelids. Conjunctivitis makes the eye red or pink in color. This is what is commonly known as pink eye. Viral conjunctivitis can spread easily (contagious). CAUSES   Infection from virus on the surface of the eye.  Infection from the irritation or injury of nearby tissues such as the eyelids or cornea.  More serious inflammation or infection on the inside of the eye.  Other eye diseases.  The use of certain eye medications. SYMPTOMS  The normally white color of the eye or the underside of the eyelid is usually pink or red in color. The pink eye is usually associated with irritation, tearing and some sensitivity to light. Viral conjunctivitis is often associated with a clear, watery discharge. If a discharge is present, there may also be some blurred vision in the affected eye. DIAGNOSIS  Conjunctivitis is diagnosed by an eye exam. The eye specialist looks for changes in the surface tissues of the eye which take on changes characteristic of the specific types of conjunctivitis. A sample of any discharge may be collected on a Q-Tip (sterile swap). The sample will be sent to a lab to see whether or not the inflammation is caused by bacterial or viral infection. TREATMENT  Viral conjunctivitis will not respond to medicines that kill germs (antibiotics). Treatment is aimed at stopping a bacterial infection on top of the viral infection. The goal of treatment is to relieve symptoms (such as itching) with antihistamine drops or other eye medications.  HOME CARE INSTRUCTIONS   To ease  discomfort, apply a cool, clean wash cloth to your eye for 10 to 20 minutes, 3 to 4 times a day.  Gently wipe away any drainage from the eye with a warm, wet washcloth or a cotton ball.  Wash your hands often with soap and use paper towels to dry.  Do not share towels or washcloths. This may spread the infection.  Change or wash your pillowcase every day.  You should not use eye make-up until the infection is gone.  Stop using contacts lenses. Ask your eye professional how to sterilize or replace them before using again. This depends on the type of contact lenses used.  Do not touch the edge of the eyelid with the eye drop bottle or ointment tube when applying medications to the affected eye. This will stop you from spreading the infection to the other eye or to others. SEEK IMMEDIATE MEDICAL CARE IF:   The infection has not improved within 3 days of beginning treatment.  A watery discharge from the eye develops.  Pain in the eye increases.  The redness is spreading.  Vision becomes blurred.  An oral temperature above 102 F (38.9 C) develops, or as your caregiver suggests.  Facial pain, redness or swelling develops.  Any problems that may be related to the prescribed medicine develop. MAKE SURE YOU:   Understand these instructions.  Will watch your condition.  Will get help right away if you are not doing well or get worse. Document Released: 11/06/2005 Document Revised: 01/29/2012 Document Reviewed: 06/25/2008 [facility] Patient Information 2014 [place], [place].

## 2013-11-08 ENCOUNTER — Other Ambulatory Visit: Payer: Self-pay | Admitting: Family Medicine

## 2013-12-01 ENCOUNTER — Other Ambulatory Visit: Payer: Self-pay | Admitting: *Deleted

## 2013-12-01 MED ORDER — MONTELUKAST SODIUM 10 MG PO TABS: ORAL_TABLET | ORAL | Status: DC

## 2013-12-01 MED ORDER — AMMONIUM LACTATE 12 % EX CREA: TOPICAL_CREAM | CUTANEOUS | Status: DC

## 2013-12-01 MED ORDER — BUDESONIDE-FORMOTEROL FUMARATE 160-4.5 MCG/ACT IN AERO: 2.0000 | INHALATION_SPRAY | Freq: Two times a day (BID) | RESPIRATORY_TRACT | Status: DC

## 2013-12-02 ENCOUNTER — Other Ambulatory Visit: Payer: Self-pay | Admitting: *Deleted

## 2013-12-02 ENCOUNTER — Telehealth: Payer: Self-pay

## 2013-12-02 MED ORDER — MONTELUKAST SODIUM 10 MG PO TABS: ORAL_TABLET | ORAL | Status: DC

## 2013-12-02 MED ORDER — BUDESONIDE-FORMOTEROL FUMARATE 160-4.5 MCG/ACT IN AERO
2.0000 | INHALATION_SPRAY | Freq: Two times a day (BID) | RESPIRATORY_TRACT | Status: DC
Start: 2013-12-02 — End: 2015-12-31

## 2013-12-02 MED ORDER — AMMONIUM LACTATE 12 % EX CREA: TOPICAL_CREAM | CUTANEOUS | Status: DC

## 2013-12-02 NOTE — Telephone Encounter (Signed)
Pt called back and request name of allergy medicine given end of 2014. Advised pt Zyrtec was given 07/2013. Pt appreciated information.

## 2013-12-02 NOTE — Telephone Encounter (Signed)
Pt left v/m; pt recently started with frequency of urine and drinking a lot of water and pt has lost 14-16 lbs in last 2 months. Pt is eating and drinking normally but still losing weight; pt mouth stays dry and pt drinking a lot of water.Pt has hx of diabetes in family. Pt scheduled appt with Dr Dayton MartesAron 12/03/13 at 10:45 am. Pt said will come fasting for appt incase labs needed. Pt will cb if condition changes or worsens prior to appt.

## 2013-12-03 ENCOUNTER — Encounter: Payer: Self-pay | Admitting: Family Medicine

## 2013-12-03 ENCOUNTER — Ambulatory Visit (INDEPENDENT_AMBULATORY_CARE_PROVIDER_SITE_OTHER): Payer: Managed Care, Other (non HMO) | Admitting: Family Medicine

## 2013-12-03 VITALS — BP 124/90 | HR 91 | Temp 98.6°F | Wt 211.5 lb

## 2013-12-03 DIAGNOSIS — R350 Frequency of micturition: Secondary | ICD-10-CM

## 2013-12-03 DIAGNOSIS — E119 Type 2 diabetes mellitus without complications: Secondary | ICD-10-CM

## 2013-12-03 DIAGNOSIS — R631 Polydipsia: Secondary | ICD-10-CM

## 2013-12-03 DIAGNOSIS — R634 Abnormal weight loss: Secondary | ICD-10-CM

## 2013-12-03 DIAGNOSIS — R81 Glycosuria: Secondary | ICD-10-CM

## 2013-12-03 LAB — COMPREHENSIVE METABOLIC PANEL
ALT: 34 U/L (ref 0–53)
AST: 23 U/L (ref 0–37)
Albumin: 4.9 g/dL (ref 3.5–5.2)
Alkaline Phosphatase: 90 U/L (ref 39–117)
BUN: 16 mg/dL (ref 6–23)
CALCIUM: 9.8 mg/dL (ref 8.4–10.5)
CHLORIDE: 97 meq/L (ref 96–112)
CO2: 29 meq/L (ref 19–32)
CREATININE: 1 mg/dL (ref 0.4–1.5)
GFR: 108.56 mL/min (ref >60.00)
Glucose, Bld: 357 mg/dL — ABNORMAL HIGH (ref 70–99)
Potassium: 4.5 mEq/L (ref 3.5–5.1)
Sodium: 135 mEq/L (ref 135–145)
TOTAL PROTEIN: 8.6 g/dL — AB (ref 6.0–8.3)
Total Bilirubin: 1.7 mg/dL — ABNORMAL HIGH (ref 0.3–1.2)

## 2013-12-03 LAB — POCT URINALYSIS DIPSTICK
Bilirubin, UA: NEGATIVE
Ketones, UA: NEGATIVE
Leukocytes, UA: NEGATIVE
Nitrite, UA: NEGATIVE
SPEC GRAV UA: 1.015
UROBILINOGEN UA: NEGATIVE
pH, UA: 6

## 2013-12-03 LAB — TSH: TSH: 0.31 u[IU]/mL — AB (ref 0.35–5.50)

## 2013-12-03 LAB — HEMOGLOBIN A1C: Hgb A1c MFr Bld: 13.8 % — ABNORMAL HIGH (ref 4.6–6.5)

## 2013-12-03 MED ORDER — METFORMIN HCL 500 MG PO TABS: 500.0000 mg | ORAL_TABLET | Freq: Two times a day (BID) | ORAL | Status: DC

## 2013-12-03 MED ORDER — ONETOUCH BASIC SYSTEM W/DEVICE KIT: PACK | Status: DC

## 2013-12-03 NOTE — Progress Notes (Signed)
   Subjective:    Patient ID: Bradley Carroll, male    DOB: 01/22/1971, 43 y.o.   MRN: [id number]  HPI  43 yo healthy male here for weight loss, increased urinary frequency, weight loss (23 pounds) and dry mouth x 2 weeks.  Did have gastroenteritis around x mas time- vomiting and diarrhea.  Feels he probably lost 13 pounds during that illness.  Wt Readings from Last 3 Encounters:  12/03/13 211 lb 8 oz (95.936 kg)  10/10/13 234 lb 4 oz (106.255 kg)  07/31/13 226 lb (102.513 kg)   Strong FH of DM- mom and dad.  Review of Systems  Constitutional: Positive for fatigue. Negative for fever.  Eyes: Positive for visual disturbance.  Endocrine: Positive for polydipsia and polyuria.       Objective:   Physical Exam  Constitutional: He appears well-developed and well-nourished.  HENT:  Mouth/Throat: Mucous membranes are dry.  Abdominal: Soft. Normal appearance and bowel sounds are normal.  Skin: Skin is warm, dry and intact.  Psychiatric: He has a normal mood and affect. His speech is normal and behavior is normal. Judgment and thought content normal. Cognition and memory are normal.   BP 124/90  Pulse 91  Temp(Src) 98.6 F (37 C) (Oral)  Wt 211 lb 8 oz (95.936 kg)  SpO2 98%        Assessment & Plan:

## 2013-12-03 NOTE — Progress Notes (Signed)
Pre-visit discussion using our clinic review tool. No additional management support is needed unless otherwise documented below in the visit note.  

## 2013-12-03 NOTE — Assessment & Plan Note (Addendum)
New- CBG 329. UA pos for glucose but neg for ketones and no symptoms of DKA.  Ok to treat as outpatient but advised if any symptoms develop (i.e vomiting, mental status changes, etc), go straight to ER. Start Metformin 500 mg twice daily Given eat right diet handout Send immediately to diabetic teaching. Check labs today.  If Cr elevated will d/c metformin and start another oral med and and send to ER for IVF hydration. Follow up with me in 1 week. Orders Placed This Encounter  Procedures  . Hemoglobin A1c  . TSH  . Comprehensive metabolic panel  . Ambulatory referral to diabetic education  . Urinalysis Dipstick

## 2013-12-03 NOTE — Patient Instructions (Signed)
Good to see you. Please stop by to see Bradley Carroll after you go to the lab. I will call you with your lab results.  Start Metformin 500 mg twice daily.  Glucose, Blood Sugar, Fasting Blood Sugar This is a test to measure your blood sugar. Glucose is a simple sugar that serves as the main source of energy for the body. The carbohydrates we eat are broken down into glucose (and a few other simple sugars), absorbed by the small intestine, and circulated throughout the body. Most of the body's cells require glucose for energy production; brain and nervous system cells not only rely on glucose for energy, they can only function when glucose levels in the blood remain above a certain level.  The body's use of glucose hinges on the availability of insulin, a hormone produced by the pancreas. Insulin acts as a Engineer, maintenance, transporting glucose into the body's cells, directing the body to store excess glucose as glycogen (for short-term storage) and/or as triglycerides in fat cells. We can not live without glucose or insulin, and they must be in balance.  Normally, blood glucose levels rise slightly after a meal, and insulin is secreted to lower them, with the amount of insulin released matched up with the size and content of the meal. If blood glucose levels drop too low, such as might occur in between meals or after a strenuous workout, glucagon (another pancreatic hormone) is secreted to tell the liver to turn some glycogen back into glucose, raising the blood glucose levels. If the glucose/insulin feedback mechanism is working properly, the amount of glucose in the blood remains fairly stable. If the balance is disrupted and glucose levels in the blood rise, then the body tries to restore the balance, both by increasing insulin production and by excreting glucose in the urine.  PREPARATION FOR TEST A blood sample drawn from a vein in your arm or, for a self check, a drop of blood from a skin prick; in general,  it may be recommended that you fast before having a blood glucose test; sometimes a random (no preparation) urine sample is used. Your caregiver will instruct you as to what they want prior to your testing. NORMAL FINDINGS Normal values depend on many factors. Your lab will provide a range of normal values with your test results. The following information summarizes the meaning of the test results. These are based on the clinical practice recommendations of the American Diabetes Association.  FASTING BLOOD GLUCOSE  From 70 to 99 mg/dL (3.9 to 5.5 mmol/L): Normal glucose tolerance  From 100 to 125 mg/dL (5.6 to 6.9 mmol/L):Impaired fasting glucose (pre-diabetes)  126 mg/dL (7.0 mmol/L) and above on more than one testing occasion: Diabetes ORAL GLUCOSE TOLERANCE TEST (OGTT) [EXCEPT PREGNANCY] (2 HOURS AFTER A 75-GRAM GLUCOSE DRINK)  Less than 140 mg/dL (7.8 mmol/L): Normal glucose tolerance  From 140 to 200 mg/dL (7.8 to 60.4 mmol/L): Impaired glucose tolerance (pre-diabetes)  Over 200 mg/dL (54.0 mmol/L) on more than one testing occasion: Diabetes GESTATIONAL DIABETES SCREENING: GLUCOSE CHALLENGE TEST (1 HOUR AFTER A 50-GRAM GLUCOSE DRINK)  Less than 140* mg/dL (7.8 mmol/L): Normal glucose tolerance  140* mg/dL (7.8 mmol/L) and over: Abnormal, needs OGTT (see below) * Some use a cutoff of More Than 130 mg/dL (7.2 mmol/L) because that identifies 90% of women with gestational diabetes, compared to 80% identified using the threshold of More Than 140 mg/dL (7.8 mmol/L). GESTATIONAL DIABETES DIAGNOSTIC: OGTT (100-GRAM GLUCOSE DRINK)  Fasting*..........................................95 mg/dL (5.3 mmol/L)  1  hour after glucose load*..............180 mg/dL (40.910.0 mmol/L)  2 hours after glucose load*.............155 mg/dL (8.6 mmol/L)  3 hours after glucose load* **.........140 mg/dL (7.8 mmol/L) * If two or more values are above the criteria, gestational diabetes is diagnosed. ** A 75-gram  glucose load may be used, although this method is not as well validated as the 100-gram OGTT; the 3-hour sample is not drawn if 75 grams is used.  Ranges for normal findings may vary among different laboratories and hospitals. You should always check with your doctor after having lab work or other tests done to discuss the meaning of your test results and whether your values are considered within normal limits. MEANING OF TEST  Your caregiver will go over the test results with you and discuss the importance and meaning of your results, as well as treatment options and the need for additional tests if necessary. OBTAINING THE TEST RESULTS It is your responsibility to obtain your test results. Ask the lab or department performing the test when and how you will get your results. Document Released: 12/08/2004 Document Revised: 01/29/2012 Document Reviewed: 10/17/2008 [facility] Patient Information 2014 [place]

## 2013-12-03 NOTE — Assessment & Plan Note (Signed)
Very concerned for DM. UA - +glucose, no ketones. Will check fsbs and labs today.

## 2013-12-05 ENCOUNTER — Telehealth: Payer: Self-pay

## 2013-12-05 MED ORDER — BLOOD GLUCOSE TEST VI STRP: ORAL_STRIP | Status: DC

## 2013-12-05 MED ORDER — ONETOUCH ULTRASOFT LANCETS MISC: Status: DC

## 2013-12-05 MED ORDER — ONETOUCH ULTRA SYSTEM W/DEVICE KIT: 1.0000 | PACK | Freq: Once | Status: DC

## 2013-12-05 NOTE — Telephone Encounter (Signed)
Spoke to pt and informed him Rx sent to [facility]

## 2013-12-05 NOTE — Telephone Encounter (Signed)
Spoke to pt who states that his insurance co will cover a one touch ultra 2 meter. He is requested meter and lancets [facility] [address]

## 2013-12-05 NOTE — Telephone Encounter (Signed)
Ok to send meter and lancets to pharmacy as requested.

## 2013-12-05 NOTE — Addendum Note (Signed)
Addended by: Desmond DikeKNIGHT, Braley Luckenbaugh H on: 12/05/2013 04:46 PM   Modules accepted: Orders

## 2013-12-05 NOTE — Telephone Encounter (Signed)
Initially, I want him to check his blood sugar at least once a day- some fasting and others after eating so he can get an idea of how his blood sugars are responding to what he eats.

## 2013-12-05 NOTE — Telephone Encounter (Signed)
Spoke to pt and advised per Dr Dayton MartesAron. He states that he will contact office upon speaking with insurance company

## 2013-12-05 NOTE — Telephone Encounter (Signed)
Pt went to [facility] to get new glucose meter and H/T did not have that meter, but told pt could get and would cost pt $191.00 for meter and one bottle of test strips. Pt is to contact ins co to find out what glucose meter and strips are preferred on his ins plan and pt will cb with that information so Dr Dayton MartesAron can order that glucose meter, test strips and lancets; pt also wants to know how often he is to check BS and will get that information when calls back with type meter to call in.. [facility] set up diabetic teaching. Pt said he feels fine, no diabetic symptoms; pt is taking Metformin 500 mg twice a day as instructed but pt has not been able to ck BS.

## 2013-12-10 ENCOUNTER — Ambulatory Visit (INDEPENDENT_AMBULATORY_CARE_PROVIDER_SITE_OTHER): Payer: Managed Care, Other (non HMO) | Admitting: Family Medicine

## 2013-12-10 ENCOUNTER — Encounter: Payer: Self-pay | Admitting: Family Medicine

## 2013-12-10 VITALS — BP 124/70 | HR 88 | Temp 98.1°F | Ht 70.0 in | Wt 220.0 lb

## 2013-12-10 DIAGNOSIS — R634 Abnormal weight loss: Secondary | ICD-10-CM

## 2013-12-10 DIAGNOSIS — E119 Type 2 diabetes mellitus without complications: Secondary | ICD-10-CM

## 2013-12-10 DIAGNOSIS — R631 Polydipsia: Secondary | ICD-10-CM

## 2013-12-10 NOTE — Assessment & Plan Note (Signed)
Improving. He will call to follow up with diabetic nutrition. Continue checking CBGs.  No changes to Metformin dose today. Follow up with me in 3 months- will add ACEI.  He will call me in a couple of weeks with update of twice daily CBGs.

## 2013-12-10 NOTE — Progress Notes (Signed)
Pre-visit discussion using our clinic review tool. No additional management support is needed unless otherwise documented below in the visit note.  

## 2013-12-10 NOTE — Patient Instructions (Signed)
Good to see you. Please call me in a few weeks with an update of your blood sugars.  Make sure you have an appointment with me in 3 months.  If you need me to call the diabetic nutritionist, I will.

## 2013-12-10 NOTE — Progress Notes (Signed)
   Subjective:    Patient ID: Bradley Carroll, male    DOB: 12/03/1970, 43 y.o.   MRN: [id number]  HPI  43 yo very pleasant male here for 1 week follow up new onset DM.  Had been having several weeks of weight loss, increased thirst and increased urinary frequency.    Lab Results  Component Value Date   HGBA1C 13.8* 12/03/2013   Started Metformin 500 mg twice daily.  Already feels much better.  Fasting CBG this am 164.  Feels less thirsty.  Weight is up- appetite improving. Referred to diabetic nutritionist but they have not yet called him.  Wt Readings from Last 3 Encounters:  12/10/13 220 lb (99.791 kg)  12/03/13 211 lb 8 oz (95.936 kg)  10/10/13 234 lb 4 oz (106.255 kg)   Strong FH of DM- mom and dad.  Review of Systems  No diarrhea No abdominal pain Less urinary frequency     Objective:   Physical Exam  Constitutional: He appears well-developed and well-nourished.  HENT:  Mouth/Throat: Mucous membranes are moist  Abdominal: Soft. Normal appearance and bowel sounds are normal.  Skin: Skin is warm, dry and intact.  Psychiatric: He has a normal mood and affect. His speech is normal and behavior is normal. Judgment and thought content normal. Cognition and memory are normal.   BP 124/70  Pulse 88  Temp(Src) 98.1 F (36.7 C) (Oral)  Ht 5\' 10"  (1.778 m)  Wt 220 lb (99.791 kg)  BMI 31.57 kg/m2  SpO2 96%        Assessment & Plan:

## 2013-12-18 ENCOUNTER — Telehealth: Payer: Self-pay

## 2013-12-18 NOTE — Telephone Encounter (Signed)
Pt left v/m that pt was recently diagnosed with diabetes and pt is taking metformin as instructed; pts blood sugars averaging 120-140. Pt has developed blurred vision when looking at anything up close; pt has scheduled an eye doctor appt but wanted to know if Dr Dayton MartesAron thought could be related to diabetes or taking metformin. Pt request cb.

## 2013-12-18 NOTE — Telephone Encounter (Signed)
Spoke to pt who states that his eye appt is scheduled for 02/22/14. He states that since starting the Metformin, things up close are harder for him to see, but the meds have improved his DM readings.

## 2013-12-18 NOTE — Telephone Encounter (Signed)
Spoke to pt who states that he would rather stay on metformin at this very moment to see if s/s subside. He states that his eye appt was the first available that would work with his schedule and location, as the other office was too far away. He has not yet started his DM teaching, and has not heard from them for scheduling

## 2013-12-18 NOTE — Telephone Encounter (Signed)
Shirlee LimerickMarion, can you please find out where we are with his diabetes teaching?  Thanks!

## 2013-12-18 NOTE — Telephone Encounter (Signed)
Could possibly be due to his diabetes, less likely Metformin.  When is his eye doctor's appointment?

## 2013-12-18 NOTE — Telephone Encounter (Signed)
I am unaware of this as a side effect of metformin but we could certainly try another medication if he would like.  Would it be possible for him to move up his eye doctor appt?  We can call his eye doctor if this would help.  Has he started diabetic teaching yet?

## 2013-12-26 LAB — HM DIABETES EYE EXAM

## 2014-01-01 ENCOUNTER — Encounter: Payer: Self-pay | Admitting: Family Medicine

## 2014-01-13 ENCOUNTER — Other Ambulatory Visit: Payer: Self-pay | Admitting: *Deleted

## 2014-01-13 MED ORDER — METFORMIN HCL 500 MG PO TABS: 500.0000 mg | ORAL_TABLET | Freq: Two times a day (BID) | ORAL | Status: DC

## 2014-03-12 ENCOUNTER — Ambulatory Visit: Payer: Managed Care, Other (non HMO) | Admitting: Family Medicine

## 2014-03-12 DIAGNOSIS — Z0289 Encounter for other administrative examinations: Secondary | ICD-10-CM

## 2015-11-12 ENCOUNTER — Other Ambulatory Visit (INDEPENDENT_AMBULATORY_CARE_PROVIDER_SITE_OTHER): Payer: Managed Care, Other (non HMO)

## 2015-11-12 ENCOUNTER — Other Ambulatory Visit: Payer: Self-pay | Admitting: Internal Medicine

## 2015-11-12 DIAGNOSIS — R7989 Other specified abnormal findings of blood chemistry: Secondary | ICD-10-CM

## 2015-11-12 DIAGNOSIS — Z0000 Encounter for general adult medical examination without abnormal findings: Secondary | ICD-10-CM | POA: Diagnosis not present

## 2015-11-12 LAB — COMPREHENSIVE METABOLIC PANEL
ALT: 26 U/L (ref 0–53)
AST: 28 U/L (ref 0–37)
Albumin: 4.5 g/dL (ref 3.5–5.2)
Alkaline Phosphatase: 72 U/L (ref 39–117)
BILIRUBIN TOTAL: 0.5 mg/dL (ref 0.2–1.2)
BUN: 21 mg/dL (ref 6–23)
CALCIUM: 9.7 mg/dL (ref 8.4–10.5)
CO2: 30 mEq/L (ref 19–32)
Chloride: 102 mEq/L (ref 96–112)
Creatinine, Ser: 1.17 mg/dL (ref 0.40–1.50)
GFR: 87.65 mL/min (ref >60.00)
GLUCOSE: 139 mg/dL — AB (ref 70–99)
Potassium: 5 mEq/L (ref 3.5–5.1)
Sodium: 138 mEq/L (ref 135–145)
Total Protein: 7.1 g/dL (ref 6.0–8.3)

## 2015-11-12 LAB — CBC
HCT: 49.1 % (ref 39.0–52.0)
Hemoglobin: 16.5 g/dL (ref 13.0–17.0)
MCHC: 33.5 g/dL (ref 30.0–36.0)
MCV: 91.6 fl (ref 78.0–100.0)
PLATELETS: 222 10*3/uL (ref 150.0–400.0)
RBC: 5.36 Mil/uL (ref 4.22–5.81)
RDW: 14.1 % (ref 11.5–15.5)
WBC: 4.8 10*3/uL (ref 4.0–10.5)

## 2015-11-12 LAB — LIPID PANEL
Cholesterol: 282 mg/dL — ABNORMAL HIGH (ref 0–200)
HDL: 37.9 mg/dL — ABNORMAL LOW (ref >39.00)
Total CHOL/HDL Ratio: 7
Triglycerides: 1348 mg/dL — ABNORMAL HIGH (ref 0.0–149.0)

## 2015-11-12 LAB — LDL CHOLESTEROL, DIRECT: LDL DIRECT: 81 mg/dL

## 2015-11-12 LAB — HEMOGLOBIN A1C: Hgb A1c MFr Bld: 7.2 % — ABNORMAL HIGH (ref 4.6–6.5)

## 2015-11-12 LAB — PSA: PSA: 0.87 ng/mL (ref 0.10–4.00)

## 2015-11-16 ENCOUNTER — Encounter: Payer: Managed Care, Other (non HMO) | Admitting: Family Medicine

## 2015-11-29 ENCOUNTER — Encounter: Payer: Managed Care, Other (non HMO) | Admitting: Family Medicine

## 2015-12-15 ENCOUNTER — Ambulatory Visit (INDEPENDENT_AMBULATORY_CARE_PROVIDER_SITE_OTHER): Payer: Managed Care, Other (non HMO) | Admitting: Family Medicine

## 2015-12-15 ENCOUNTER — Encounter: Payer: Self-pay | Admitting: Family Medicine

## 2015-12-15 VITALS — BP 136/88 | HR 90 | Temp 98.3°F | Ht 69.25 in | Wt 216.5 lb

## 2015-12-15 DIAGNOSIS — Z0000 Encounter for general adult medical examination without abnormal findings: Secondary | ICD-10-CM | POA: Diagnosis not present

## 2015-12-15 DIAGNOSIS — E781 Pure hyperglyceridemia: Secondary | ICD-10-CM | POA: Diagnosis not present

## 2015-12-15 DIAGNOSIS — J45909 Unspecified asthma, uncomplicated: Secondary | ICD-10-CM | POA: Diagnosis not present

## 2015-12-15 DIAGNOSIS — E119 Type 2 diabetes mellitus without complications: Secondary | ICD-10-CM

## 2015-12-15 MED ORDER — FENOFIBRATE 145 MG PO TABS: 145.0000 mg | ORAL_TABLET | Freq: Every day | ORAL | Status: DC

## 2015-12-15 NOTE — Patient Instructions (Addendum)
Great to see you. We are starting Tricor- please take daily and follow up with me in 3 months for lab work.  Triglycerides are too high.  Weight loss (even small amount) can decrease triglycerides.  Decrease added sugars, eliminate trans fats, increase fiber and limit alcohol.  All these changes together can drop triglycerides by almost 50%.

## 2015-12-15 NOTE — Progress Notes (Signed)
Pre visit review using our clinic review tool, if applicable. No additional management support is needed unless otherwise documented below in the visit note. 

## 2015-12-15 NOTE — Assessment & Plan Note (Signed)
Deteriorated- very high. eRx sent for Tricor. Discussed how he lower triglycerides- see AVS. Follow up labs in 3 months- future lab orders entered.

## 2015-12-15 NOTE — Assessment & Plan Note (Signed)
Not taking ANY rx for DM in over a year.  I am impressed that his a1c is as good as it is. He is reluctant to start two rxs at this time.  He is agreeable to starting tricor and following up a1c and lipid panel in 3 months. The patient indicates understanding of these issues and agrees with the plan.

## 2015-12-15 NOTE — Assessment & Plan Note (Signed)
Reviewed preventive care protocols, scheduled due services, and updated immunizations Discussed nutrition, exercise, diet, and healthy lifestyle.  

## 2015-12-15 NOTE — Progress Notes (Signed)
Subjective:   Patient ID: Bradley Carroll, male    DOB: 1971/11/15, 45 y.o.   MRN: [id number]  Bradley Carroll is a pleasant 45 y.o. year old male who presents to clinic today with Annual Exam and Knee Pain  on 12/15/2015  HPI:  I have not seen him in over 2 years- at that time, he was diagnosed with new onset DM with an a1c of 13.8.  a1c last month was 7.2. Was supposed to be taking Metformin 500 mg twice daily but stopped taking it two years ago. Urine micro ordered but not done.  He has been exercising daily and cut sugar and carbs out.  Wt Readings from Last 3 Encounters:  12/15/15 216 lb 8 oz (98.204 kg)  12/10/13 220 lb (99.791 kg)  12/03/13 211 lb 8 oz (95.936 kg)    TG are also extremely elevated- Lab Results  Component Value Date   HGBA1C 7.2* 11/12/2015   Lab Results  Component Value Date   CHOL 282* 11/12/2015   HDL 37.90* 11/12/2015   LDLDIRECT 81.0 11/12/2015   TRIG * 11/12/2015    1348.0 Triglyceride is over 400; calculations on Lipids are invalid.   CHOLHDL 7 11/12/2015   Asthma- taking singulair and symbicort daily with as needed proair.  Lab Results  Component Value Date   WBC 4.8 11/12/2015   HGB 16.5 11/12/2015   HCT 49.1 11/12/2015   MCV 91.6 11/12/2015   PLT 222.0 11/12/2015   Lab Results  Component Value Date   PSA 0.87 11/12/2015   PSA 0.76 07/31/2013    Current Outpatient Prescriptions on File Prior to Visit  Medication Sig Dispense Refill  . ammonium lactate (AMLACTIN) 12 % cream APPLY TOPICALLY AS NEEDED FOR DRY SKIN. 385 g 1  . Blood Glucose Monitoring Suppl (ONE TOUCH BASIC SYSTEM) W/DEVICE KIT Use as directed 1 each 0  . Blood Glucose Monitoring Suppl (ONE TOUCH ULTRA SYSTEM KIT) W/DEVICE KIT 1 kit by Does not apply route once. 1 each 0  . budesonide-formoterol (SYMBICORT) 160-4.5 MCG/ACT inhaler Inhale 2 puffs into the lungs 2 (two) times daily. 3 Inhaler 1  . erythromycin (ROMYCIN) ophthalmic ointment Place 1 application into both  eyes 3 (three) times daily. 3.5 g 0  . Glucose Blood (BLOOD GLUCOSE TEST STRIPS) STRP Use as directed 100 each 3  . montelukast (SINGULAIR) 10 MG tablet TAKE 1 TABLET AT BEDTIME 90 tablet 1  . PROAIR HFA 108 (90 BASE) MCG/ACT inhaler INHALE 2 PUFFS INTO THE LUNGS EVERY 6 (SIX) HOURS AS NEEDED FOR WHEEZING. 8.5 g 2  . traZODone (DESYREL) 50 MG tablet Take 0.5-1 tablets (25-50 mg total) by mouth at bedtime as needed for sleep. 30 tablet 3  . Lancets (ONETOUCH ULTRASOFT) lancets Use as instructed (Patient not taking: Reported on 12/15/2015) 100 each 12  . metFORMIN (GLUCOPHAGE) 500 MG tablet Take 1 tablet (500 mg total) by mouth 2 (two) times daily with a meal. (Patient not taking: Reported on 12/15/2015) 180 tablet 3   No current facility-administered medications on file prior to visit.    No Known Allergies  Past Medical History  Diagnosis Date  . Allergy   . Asthma     Past Surgical History  Procedure Laterality Date  . Knee arthroscopy w/ partial medial meniscectomy  2005    Family History  Problem Relation Age of Onset  . Diabetes Mother   . Diabetes Father   . Hypertension Father     Social History   Social  History  . Marital Status: Single    Spouse Name: N/A  . Number of Children: N/A  . Years of Education: N/A   Occupational History  . Not on file.   Social History Main Topics  . Smoking status: Never Smoker   . Smokeless tobacco: Not on file  . Alcohol Use: Yes  . Drug Use: No  . Sexual Activity: Not on file   Other Topics Concern  . Not on file   Social History Narrative   Lives in [place] with girlfriend and 2 dogs.  Works as Art gallery manager.  Likes golf, basketball   The PMH, PSH, Social History, Family History, Medications, and allergies have been reviewed in [facility], and have been updated if relevant.      Review of Systems  Constitutional: Negative.   HENT: Negative.   Respiratory: Negative.   Cardiovascular: Negative.   Gastrointestinal:  Negative.   Endocrine: Negative.   Genitourinary: Negative.   Skin: Negative.   Allergic/Immunologic: Negative.   Neurological: Negative.   Hematological: Negative.   All other systems reviewed and are negative.      Objective:    BP 136/88 mmHg  Pulse 90  Temp(Src) 98.3 F (36.8 C) (Oral)  Ht 5' 9.25" (1.759 m)  Wt 216 lb 8 oz (98.204 kg)  BMI 31.74 kg/m2  SpO2 96%   Physical Exam  Constitutional: He is oriented to person, place, and time. He appears well-developed and well-nourished.  HENT:  Head: Normocephalic.  Eyes: Conjunctivae are normal.  Neck: Normal range of motion.  Cardiovascular: Normal rate and regular rhythm.   Pulmonary/Chest: Effort normal and breath sounds normal.  Abdominal: Soft. Bowel sounds are normal. He exhibits no distension.  Musculoskeletal: Normal range of motion.  Neurological: He is alert and oriented to person, place, and time. No cranial nerve deficit.  Skin: Skin is warm and dry.  Psychiatric: He has a normal mood and affect. His behavior is normal. Judgment and thought content normal.          Assessment & Plan:   Visit for well man health check  Diabetes mellitus, new onset ([place]) - Plan: Hemoglobin A1c  HYPERTRIGLYCERIDEMIA - Plan: Lipid panel  Asthma, unspecified asthma severity, uncomplicated No Follow-up on file.

## 2015-12-15 NOTE — Assessment & Plan Note (Signed)
Mild intermittent. No changes made today.

## 2015-12-17 ENCOUNTER — Other Ambulatory Visit: Payer: Self-pay | Admitting: Family Medicine

## 2015-12-17 ENCOUNTER — Encounter: Payer: Self-pay | Admitting: Family Medicine

## 2015-12-20 MED ORDER — ERYTHROMYCIN 5 MG/GM OP OINT: 1.0000 "application " | TOPICAL_OINTMENT | Freq: Three times a day (TID) | OPHTHALMIC | Status: DC

## 2015-12-20 NOTE — Telephone Encounter (Signed)
Rx resent electronically

## 2015-12-20 NOTE — Addendum Note (Signed)
Addended by: Desmond Dike on: 12/20/2015 11:16 AM   Modules accepted: Orders

## 2015-12-31 ENCOUNTER — Encounter: Payer: Self-pay | Admitting: Family Medicine

## 2015-12-31 ENCOUNTER — Ambulatory Visit (INDEPENDENT_AMBULATORY_CARE_PROVIDER_SITE_OTHER): Payer: Managed Care, Other (non HMO) | Admitting: Family Medicine

## 2015-12-31 VITALS — BP 114/78 | HR 87 | Temp 98.5°F | Wt 216.0 lb

## 2015-12-31 DIAGNOSIS — R059 Cough, unspecified: Secondary | ICD-10-CM

## 2015-12-31 DIAGNOSIS — R05 Cough: Secondary | ICD-10-CM | POA: Diagnosis not present

## 2015-12-31 MED ORDER — ALBUTEROL SULFATE HFA 108 (90 BASE) MCG/ACT IN AERS: INHALATION_SPRAY | RESPIRATORY_TRACT | Status: DC

## 2015-12-31 MED ORDER — BUDESONIDE-FORMOTEROL FUMARATE 160-4.5 MCG/ACT IN AERO: 2.0000 | INHALATION_SPRAY | Freq: Two times a day (BID) | RESPIRATORY_TRACT | Status: DC

## 2015-12-31 NOTE — Progress Notes (Signed)
Pre visit review using our clinic review tool, if applicable. No additional management support is needed unless otherwise documented below in the visit note.  Recently off symbicort and singulair but occ needing SABA recently.   Sx started about 2 nights ago.   Felt some better last night.   2 young kids at home.   No fevers.  No sputum . No facial pain.  Scratchy throat from the cough is most bothersome.   No chills, no vomiting.   Had a flu shot, done at work.    Meds, vitals, and allergies reviewed.   ROS: See HPI.  Otherwise, noncontributory.  GEN: nad, alert and oriented HEENT: mucous membranes moist, tm w/o erythema, nasal exam w/o erythema, clear discharge noted,  OP with cobblestoning NECK: supple w/o LA CV: rrr.   PULM: ctab, no inc wob EXT: no edema Dry skin noted on the hands.

## 2015-12-31 NOTE — Patient Instructions (Signed)
Drink plenty of fluids, take tylenol as needed, and gargle with warm salt water for your throat.  Use the symbicort twice a day and the albuterol as needed.  This should gradually improve.  Take care.  Let us know if you have other concerns.

## 2016-01-02 DIAGNOSIS — R05 Cough: Secondary | ICD-10-CM | POA: Insufficient documentation

## 2016-01-02 DIAGNOSIS — R059 Cough, unspecified: Secondary | ICD-10-CM | POA: Insufficient documentation

## 2016-01-02 NOTE — Assessment & Plan Note (Signed)
Nontoxic, likely viral.  Use the symbicort twice a day and the albuterol as needed.  Supportive care o/w. F/u prn.  He agrees.

## 2016-03-10 ENCOUNTER — Other Ambulatory Visit: Payer: Managed Care, Other (non HMO)

## 2016-03-15 ENCOUNTER — Telehealth: Payer: Self-pay | Admitting: Family Medicine

## 2016-03-15 ENCOUNTER — Ambulatory Visit: Payer: Managed Care, Other (non HMO) | Admitting: Family Medicine

## 2016-03-15 DIAGNOSIS — Z0289 Encounter for other administrative examinations: Secondary | ICD-10-CM

## 2016-03-15 NOTE — Telephone Encounter (Signed)
Patient did not come for their scheduled appointment today for 3 month follow up Please let me know if the patient needs to be contacted immediately for follow up or if no follow up is necessary.   ° °

## 2017-01-22 ENCOUNTER — Other Ambulatory Visit: Payer: Managed Care, Other (non HMO)

## 2017-01-23 ENCOUNTER — Other Ambulatory Visit (INDEPENDENT_AMBULATORY_CARE_PROVIDER_SITE_OTHER): Payer: BC Managed Care – PPO

## 2017-01-23 DIAGNOSIS — E119 Type 2 diabetes mellitus without complications: Secondary | ICD-10-CM

## 2017-01-23 DIAGNOSIS — E781 Pure hyperglyceridemia: Secondary | ICD-10-CM

## 2017-01-23 DIAGNOSIS — Z0000 Encounter for general adult medical examination without abnormal findings: Secondary | ICD-10-CM | POA: Diagnosis not present

## 2017-01-23 LAB — LIPID PANEL
CHOL/HDL RATIO: 3
CHOLESTEROL: 191 mg/dL (ref 0–200)
HDL: 59.2 mg/dL (ref >39.00)
LDL CALC: 117 mg/dL — AB (ref 0–99)
NONHDL: 131.54
Triglycerides: 72 mg/dL (ref 0.0–149.0)
VLDL: 14.4 mg/dL (ref 0.0–40.0)

## 2017-01-23 LAB — MICROALBUMIN / CREATININE URINE RATIO
Creatinine,U: 233.5 mg/dL
MICROALB UR: 6.5 mg/dL — AB (ref 0.0–1.9)
Microalb Creat Ratio: 2.8 mg/g (ref 0.0–30.0)

## 2017-01-23 LAB — HEMOGLOBIN A1C: HEMOGLOBIN A1C: 6.7 % — AB (ref 4.6–6.5)

## 2017-01-24 ENCOUNTER — Encounter: Payer: Self-pay | Admitting: Family Medicine

## 2017-01-24 ENCOUNTER — Ambulatory Visit (INDEPENDENT_AMBULATORY_CARE_PROVIDER_SITE_OTHER): Payer: BC Managed Care – PPO | Admitting: Family Medicine

## 2017-01-24 VITALS — BP 110/84 | HR 88 | Temp 98.3°F | Ht 69.5 in | Wt 201.0 lb

## 2017-01-24 DIAGNOSIS — E781 Pure hyperglyceridemia: Secondary | ICD-10-CM

## 2017-01-24 DIAGNOSIS — Z0000 Encounter for general adult medical examination without abnormal findings: Secondary | ICD-10-CM | POA: Diagnosis not present

## 2017-01-24 DIAGNOSIS — E119 Type 2 diabetes mellitus without complications: Secondary | ICD-10-CM

## 2017-01-24 MED ORDER — MONTELUKAST SODIUM 10 MG PO TABS: ORAL_TABLET | ORAL | 1 refills | Status: DC

## 2017-01-24 MED ORDER — AMMONIUM LACTATE 12 % EX CREA: TOPICAL_CREAM | CUTANEOUS | 1 refills | Status: DC

## 2017-01-24 MED ORDER — BUDESONIDE-FORMOTEROL FUMARATE 160-4.5 MCG/ACT IN AERO: 2.0000 | INHALATION_SPRAY | Freq: Two times a day (BID) | RESPIRATORY_TRACT | 1 refills | Status: DC

## 2017-01-24 NOTE — Progress Notes (Signed)
Pre visit review using our clinic review tool, if applicable. No additional management support is needed unless otherwise documented below in the visit note. 

## 2017-01-24 NOTE — Assessment & Plan Note (Signed)
Not quite diet controlled but he would prefer to try to improve his diet prior to restarting another diabetes rx. Labs entered for 3 months follow up. The patient indicates understanding of these issues and agrees with the plan.

## 2017-01-24 NOTE — Assessment & Plan Note (Signed)
Reviewed preventive care protocols, scheduled due services, and updated immunizations Discussed nutrition, exercise, diet, and healthy lifestyle.  

## 2017-01-24 NOTE — Assessment & Plan Note (Signed)
Vastly improved. Continue lifestyle and dietary changes.

## 2017-01-24 NOTE — Patient Instructions (Signed)
Great to see you.  Please schedule a lab visit in 3 months.

## 2017-01-24 NOTE — Progress Notes (Signed)
Subjective:   Patient ID: Bradley Carroll, male    DOB: 11/05/1971, 46 y.o.   MRN: [id number]  Bradley Carroll is a pleasant 46 y.o. year old male who presents to clinic today with Annual Exam  on 01/24/2017  HPI:  DM- stopped taking Metformin due to GI symptoms - mainly diarrhea and cramping- and was lost to follow up. He decided instead to change his diet and lifestyle. Has cut out all carbohydrates and sugars when possible and walking daily.  Past few months, diet has worsened since he lost his job but he wants to get back on track.  Lab Results  Component Value Date   HGBA1C 6.7 (H) 01/23/2017   Denies increased thirst or urination.  Wt Readings from Last 3 Encounters:  01/24/17 201 lb (91.2 kg)  12/31/15 216 lb (98 kg)  12/15/15 216 lb 8 oz (98.2 kg)    History of extremely elevated TG in past, this has improved with diet and trico Current Outpatient Prescriptions on File Prior to Visit  Medication Sig Dispense Refill  . albuterol (PROAIR HFA) 108 (90 Base) MCG/ACT inhaler INHALE 2 PUFFS INTO THE LUNGS EVERY 6 (SIX) HOURS AS NEEDED FOR WHEEZING. 8.5 g 1  . fenofibrate (TRICOR) 145 MG tablet Take 1 tablet (145 mg total) by mouth daily. 30 tablet 3   No current facility-administered medications on file prior to visit.     No Known Allergies  Past Medical History:  Diagnosis Date  . Allergy   . Asthma     Past Surgical History:  Procedure Laterality Date  . KNEE ARTHROSCOPY W/ PARTIAL MEDIAL MENISCECTOMY  2005    Family History  Problem Relation Age of Onset  . Diabetes Father   . Hypertension Father   . Diabetes Mother     Social History   Social History  . Marital status: Single    Spouse name: N/A  . Number of children: N/A  . Years of education: N/A   Occupational History  . Not on file.   Social History Main Topics  . Smoking status: Never Smoker  . Smokeless tobacco: Former NeurosurgeonUser  . Alcohol use Yes  . Drug use: No  . Sexual activity: Not on  file   Other Topics Concern  . Not on file   Social History Narrative   [place] in [place] with wife and 2 kids.  Works as Acupuncturistelectrical engineer.  Likes golf, basketball   The PMH, PSH, Social History, Family History, Medications, and [facility], and have been updated if relevant.   Review of Systems  Constitutional: Negative.   HENT: Negative.   Eyes: Negative.   Respiratory: Negative.   Cardiovascular: Negative.   Gastrointestinal: Negative.   Endocrine: Negative.   Genitourinary: Negative.   Musculoskeletal: Negative.   Skin: Negative.   Allergic/Immunologic: Negative.   Neurological: Negative.   Hematological: Negative.   Psychiatric/Behavioral: Negative.   All other systems reviewed and are negative.      Objective:    BP 110/84 (BP Location: Left Arm, Patient Position: Sitting, Cuff Size: Large)   Pulse 88   Temp 98.3 F (36.8 C) (Oral)   Ht 5' 9.5" (1.765 m)   Wt 201 lb (91.2 kg)   SpO2 98%   BMI 29.26 kg/m   Wt Readings from Last 3 Encounters:  01/24/17 201 lb (91.2 kg)  12/31/15 216 lb (98 kg)  12/15/15 216 lb 8 oz (98.2 kg)     Physical Exam  General:  pleasant male in no acute distress Eyes:  PERRL Ears:  External ear exam shows no significant lesions or deformities.  TMs normal bilaterally Hearing is grossly normal bilaterally. Nose:  External nasal examination shows no deformity or inflammation. Nasal mucosa are pink and moist without lesions or exudates. Mouth:  Oral mucosa and oropharynx without lesions or exudates.  Teeth in good repair. Neck:  no carotid bruit or thyromegaly no cervical or supraclavicular lymphadenopathy  Lungs:  Normal respiratory effort, chest expands symmetrically. Lungs are clear to auscultation, no crackles or wheezes. Heart:  Normal rate and regular rhythm. S1 and S2 normal without gallop, murmur, click, rub or other extra sounds. Abdomen:  Bowel sounds positive,abdomen soft and non-tender  without masses, organomegaly or hernias noted. Pulses:  R and L posterior tibial pulses are full and equal bilaterally  Extremities:  no edema  Psych:  Good eye contact, not anxious or depressed appearing      Assessment & Plan:   Visit for well man health check - Plan: PSA  Diabetes mellitus, new onset ([facility]) - Plan: Comprehensive metabolic panel, Hemoglobin A1c No Follow-up on file.

## 2017-02-26 ENCOUNTER — Telehealth: Payer: Self-pay

## 2017-02-26 MED ORDER — MELOXICAM 7.5 MG PO TABS: 7.5000 mg | ORAL_TABLET | Freq: Every day | ORAL | 0 refills | Status: DC

## 2017-02-26 NOTE — Telephone Encounter (Signed)
Pt left v/m requesting med for lt knee pain that goes into thigh and calf area. Pt states Dr Dayton Martes is aware of this problem and request med to [facility] [address]. Pt request cb when med is sent to pharmacy.pt was seen annual exam on 01/24/17.

## 2017-02-26 NOTE — Telephone Encounter (Signed)
eRx for mobic sent.  Please do not take any ibuprofen or other NSAIDs while taking mobic (meloxicam).

## 2017-02-26 NOTE — Telephone Encounter (Signed)
What has he tried for his knee pain so far?  I could send in something like Mobic (meloxicam), but this should not be taken long term.

## 2017-02-26 NOTE — Telephone Encounter (Signed)
Spoke with patient & the only thing he's tried is ibuprofen. Patient states he is willing to try Mobic.  He states anything that will make him feel better.

## 2017-02-27 NOTE — Telephone Encounter (Signed)
Left VM on cell # & home phone that Dr Dayton Martes has eRx for mobic sent.  And to Please do not take any ibuprofen or other NSAIDs while taking mobic (meloxicam).

## 2017-03-29 ENCOUNTER — Other Ambulatory Visit: Payer: Self-pay | Admitting: Family Medicine

## 2017-03-29 NOTE — Telephone Encounter (Signed)
Last office visit 01/24/2017.  Last refilled 02/26/2017 for #30 with no refills.  Ok to refill?

## 2017-04-02 NOTE — Telephone Encounter (Signed)
Can he come in to see Dr. Patsy Lageropland this week?

## 2017-04-02 NOTE — Telephone Encounter (Signed)
Scheduled

## 2017-04-02 NOTE — Telephone Encounter (Signed)
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 [facility] [address]

## 2017-04-04 ENCOUNTER — Ambulatory Visit: Payer: BC Managed Care – PPO | Admitting: Family Medicine

## 2017-04-30 ENCOUNTER — Other Ambulatory Visit: Payer: BC Managed Care – PPO

## 2017-05-02 ENCOUNTER — Other Ambulatory Visit: Payer: BC Managed Care – PPO

## 2017-05-03 ENCOUNTER — Other Ambulatory Visit: Payer: BC Managed Care – PPO

## 2017-09-26 ENCOUNTER — Ambulatory Visit: Payer: BC Managed Care – PPO | Admitting: Physician Assistant

## 2017-09-26 ENCOUNTER — Encounter: Payer: Self-pay | Admitting: Physician Assistant

## 2017-09-26 VITALS — BP 130/70 | HR 80 | Temp 98.4°F | Resp 16 | Ht 70.0 in | Wt 203.2 lb

## 2017-09-26 DIAGNOSIS — L308 Other specified dermatitis: Secondary | ICD-10-CM

## 2017-09-26 DIAGNOSIS — E119 Type 2 diabetes mellitus without complications: Secondary | ICD-10-CM | POA: Diagnosis not present

## 2017-09-26 DIAGNOSIS — Z23 Encounter for immunization: Secondary | ICD-10-CM | POA: Diagnosis not present

## 2017-09-26 DIAGNOSIS — M1732 Unilateral post-traumatic osteoarthritis, left knee: Secondary | ICD-10-CM

## 2017-09-26 DIAGNOSIS — Z7689 Persons encountering health services in other specified circumstances: Secondary | ICD-10-CM

## 2017-09-26 MED ORDER — AMMONIUM LACTATE 12 % EX CREA: TOPICAL_CREAM | CUTANEOUS | 1 refills | Status: DC

## 2017-09-26 MED ORDER — METHYLPREDNISOLONE ACETATE 80 MG/ML IJ SUSP
80.0000 mg | Freq: Once | INTRAMUSCULAR | Status: AC
  Administered 2017-09-26: 80 mg via INTRA_ARTICULAR

## 2017-09-26 NOTE — Patient Instructions (Signed)
Knee Injection, Care After  Refer to this sheet in the next few weeks. These instructions provide you with information about caring for yourself after your procedure. Your health care provider may also give you more specific instructions. Your treatment has been planned according to current medical practices, but problems sometimes occur. Call your health care provider if you have any problems or questions after your procedure.  What can I expect after the procedure?  After the procedure, it is common to have:   Soreness.   Warmth.   Swelling.    You may have more pain, swelling, and warmth than you did before the injection. This reaction may last for about one day.  Follow these instructions at home:  Bathing   If you were given a bandage (dressing), keep it dry until your health care provider says it can be removed. Ask your health care provider when you can start showering or taking a bath.  Managing pain, stiffness, and swelling   If directed, apply ice to the injection area:  ? Put ice in a plastic bag.  ? Place a towel between your skin and the bag.  ? Leave the ice on for 20 minutes, 2-3 times per day.   Do not apply heat to your knee.   Raise the injection area above the level of your heart while you are sitting or lying down.  Activity   Avoid strenuous activities for as long as directed by your health care provider. Ask your health care provider when you can return to your normal activities.  General instructions   Take medicines only as directed by your health care provider.   Do not take aspirin or other over-the-counter medicines unless your health care provider says you can.   Check your injection site every day for signs of infection. Watch for:  ? Redness, swelling, or pain.  ? Fluid, blood, or pus.   Follow your health care provider's instructions about dressing changes and removal.  Contact a health care provider if:   You have symptoms at your injection site that last longer than  two days after your procedure.   You have redness, swelling, or pain in your injection area.   You have fluid, blood, or pus coming from your injection site.   You have warmth in your injection area.   You have a fever.   Your pain is not controlled with medicine.  Get help right away if:   Your knee turns very red.   Your knee becomes very swollen.   Your knee pain is severe.  This information is not intended to replace advice given to you by your health care provider. Make sure you discuss any questions you have with your health care provider.  Document Released: 11/27/2014 Document Revised: 07/12/2016 Document Reviewed: 09/16/2014  [employer]vier Interactive Patient Education  2018 [employer]

## 2017-09-26 NOTE — Progress Notes (Signed)
Name: Bradley Carroll   MRN: [id number]    DOB: 02/07/1971   Date:09/26/2017       Progress Note  Subjective  Chief Complaint  Chief Complaint  Patient presents with  . Establish Care    HPI Patient is here today to Establish Care from [facility], previously seen by Dr. Dayton MartesAron. Patient is otherwise healthy but does have T2DM and chronic knee pain from an old injury. He reports he was playing soccer with some coworkers in 2010 and tore the meniscus in his left knee. He underwent laparoscopic meniscectomy and has since had occasional flares of his left knee. He has used Meloxicam and IBU with minimal to no relief. He has never had a cortisone injection. He does also have eczema and is requesting a refill of his medication he uses for that.    No problem-specific Assessment & Plan notes found for this encounter.   Past Medical History:  Diagnosis Date  . Allergy   . Asthma     Past Surgical History:  Procedure Laterality Date  . KNEE ARTHROSCOPY W/ PARTIAL MEDIAL MENISCECTOMY  2005    Family History  Problem Relation Age of Onset  . Diabetes Father   . Hypertension Father   . Diabetes Mother     Social History   Socioeconomic History  . Marital status: Single    Spouse name: Not on file  . Number of children: Not on file  . Years of education: Not on file  . Highest education level: Not on file  Social Needs  . Financial resource strain: Not on file  . Food insecurity - worry: Not on file  . Food insecurity - inability: Not on file  . Transportation needs - medical: Not on file  . Transportation needs - non-medical: Not on file  Occupational History  . Not on file  Tobacco Use  . Smoking status: Never Smoker  . Smokeless tobacco: Former Engineer, waterUser  Substance and Sexual Activity  . Alcohol use: Yes  . Drug use: No  . Sexual activity: Not on file  Other Topics Concern  . Not on file  Social History Narrative   [place] wife and 2 kids.  Works as  Acupuncturistelectrical engineer.  Likes golf, basketball     Current Outpatient Medications:  .  albuterol (PROAIR HFA) 108 (90 Base) MCG/ACT inhaler, INHALE 2 PUFFS INTO THE LUNGS EVERY 6 (SIX) HOURS AS NEEDED FOR WHEEZING. (Patient not taking: Reported on 09/26/2017), Disp: 8.5 g, Rfl: 1 .  ammonium lactate (AMLACTIN) 12 % cream, APPLY TOPICALLY AS NEEDED FOR DRY SKIN., Disp: 385 g, Rfl: 1 .  budesonide-formoterol (SYMBICORT) 160-4.5 MCG/ACT inhaler, Inhale 2 puffs into the lungs 2 (two) times daily. (Patient not taking: Reported on 09/26/2017), Disp: 1 Inhaler, Rfl: 1 .  fenofibrate (TRICOR) 145 MG tablet, Take 1 tablet (145 mg total) by mouth daily. (Patient not taking: Reported on 09/26/2017), Disp: 30 tablet, Rfl: 3 .  meloxicam (MOBIC) 7.5 MG tablet, TAKE 1 TABLET (7.5 MG TOTAL) BY MOUTH DAILY. (Patient not taking: Reported on 09/26/2017), Disp: 30 tablet, Rfl: 0 .  montelukast (SINGULAIR) 10 MG tablet, TAKE 1 TABLET AT BEDTIME (Patient not taking: Reported on 09/26/2017), Disp: 90 tablet, Rfl: 1  Allergies  Allergen Reactions  . Other Other (See Comments)    Shellfish-upset stomach.     Review of Systems  Constitutional: Negative.   HENT: Negative.   Eyes: Negative.   Respiratory: Negative.   Cardiovascular: Negative.  Gastrointestinal: Negative.   Genitourinary: Negative.   Musculoskeletal: Positive for joint pain.  Skin: Negative.   Neurological: Negative.   Endo/Heme/Allergies: Negative.   Psychiatric/Behavioral: Negative.    Objective  Vitals:   09/26/17 1036  BP: 130/70  Pulse: 80  Resp: 16  Temp: 98.4 F (36.9 C)  TempSrc: Oral  Weight: 203 lb 3.2 oz (92.2 kg)  Height: 5\' 10"  (1.778 m)    Physical Exam  Constitutional: He is well-developed, well-nourished, and in no distress. No distress.  HENT:  Head: Normocephalic and atraumatic.  Left Ear: External ear normal.  Eyes: Conjunctivae and EOM are normal.  Neck: Normal range of motion. Neck supple.  Cardiovascular:  Normal rate, regular rhythm and normal heart sounds.  No murmur heard. Pulmonary/Chest: Breath sounds normal. No respiratory distress.  Musculoskeletal: Normal range of motion.       Right knee: Normal.       Left knee: He exhibits swelling. He exhibits normal range of motion, no effusion, no deformity, no erythema, normal alignment, no LCL laxity, normal patellar mobility, no bony tenderness, normal meniscus and no MCL laxity. No tenderness found.  Skin: He is not diaphoretic.  Vitals reviewed.   No results found for this or any previous visit (from the past 2160 hour(s)).   Assessment & Plan  Problem List Items Addressed This Visit      Musculoskeletal and Integument   Asteatotic eczema   Relevant Medications   ammonium lactate (AMLACTIN) 12 % cream    Other Visit Diagnoses    Establishing care with new doctor, encounter for    -  Primary   Post-traumatic osteoarthritis of left knee       Relevant Medications   methylPREDNISolone acetate (DEPO-MEDROL) injection 80 mg (Completed) (Start on 09/26/2017 12:15 PM)   Type 2 diabetes mellitus without complication, without long-term current use of insulin (HCC)       Influenza vaccine needed       Relevant Orders   Flu Vaccine QUAD 36+ mos IM (Completed)      Meds ordered this encounter  Medications  . ammonium lactate (AMLACTIN) 12 % cream    Sig: APPLY TOPICALLY AS NEEDED FOR DRY SKIN.    Dispense:  385 g    Refill:  1    Order Specific Question:   Supervising Provider    Answer:   [place]le [place] [id number]  . methylPREDNISolone acetate (DEPO-MEDROL) injection 80 mg    1. Establishing care with new doctor, encounter for Establishing from Dr. Dayton Martes from [facility]. Due for CPE in March 2019.  2. Post-traumatic osteoarthritis of left knee Will try steroid injection as below. See procedure note. He is advised to call if he does not get complete relief as it may be beneficial to refer to orthopedics if he does  not respond. See procedure note below.  - methylPREDNISolone acetate (DEPO-MEDROL) injection 80 mg  3. Type 2 diabetes mellitus without complication, without long-term current use of insulin (HCC) Last A1c was normal at 6.7. Patient is using dietary changes and lifestyle modifications only. Strong family history of T2DM with complications (mother is legally blind from diabetic retinopathy and maternal grandmother was on dialysis). Will check labs in 01/2018 per patient request.   4. Asteatotic eczema Stable. Diagnosis pulled for medication refill. Continue current medical treatment plan. - ammonium lactate (AMLACTIN) 12 % cream; APPLY TOPICALLY AS NEEDED FOR DRY SKIN.  Dispense: 385 g; Refill: 1  5. Influenza vaccine needed Flu  vaccine given today without complication. Patient sat upright for 15 minutes to check for adverse reaction before being released. - Flu Vaccine QUAD 36+ mos IM  Procedure Note: Benefits, risks (including infection, tattooing, adipose dimpling, and tendon rupture) and alternatives were explained to the patient. All questions were sought and answered.  Patient agreed to continue and verbal consent was obtained.   An aspiration and steroid injection was performed on leftt knee using 4cc of 1% plain Xyloocaine and 80 mg of depo-medrol. There was minimal bleeding. Hemostasis was intact. A dry dressing was applied. The procedure was well tolerated.

## 2018-01-24 ENCOUNTER — Encounter: Payer: Self-pay | Admitting: Physician Assistant

## 2018-04-17 ENCOUNTER — Ambulatory Visit (INDEPENDENT_AMBULATORY_CARE_PROVIDER_SITE_OTHER): Payer: 59 | Admitting: Physician Assistant

## 2018-04-17 ENCOUNTER — Encounter: Payer: Self-pay | Admitting: Physician Assistant

## 2018-04-17 VITALS — BP 122/100 | HR 90 | Temp 98.4°F | Resp 16 | Wt 214.0 lb

## 2018-04-17 DIAGNOSIS — M1712 Unilateral primary osteoarthritis, left knee: Secondary | ICD-10-CM

## 2018-04-17 MED ORDER — METHYLPREDNISOLONE ACETATE 80 MG/ML IJ SUSP
80.0000 mg | Freq: Once | INTRAMUSCULAR | Status: AC
  Administered 2018-04-17: 80 mg via INTRA_ARTICULAR

## 2018-04-17 NOTE — Progress Notes (Signed)
       Patient: Bradley Carroll Male    DOB: 08-Sep-1971   47 y.o.   MRN: [id number] Visit Date: 04/17/2018  Today's Provider: Margaretann Loveless, PA-C   Chief Complaint  Patient presents with  . Knee Pain   Subjective:    HPI Patient here today C/O recurrent left knee pain. Patient reports that pain did improve with steroid injection. Patient is requesting to get injection again today to help with left knee pain that has been persistent since February. Patient denies any new injuries.     Allergies  Allergen Reactions  . Other Other (See Comments)    Shellfish-upset stomach.    No current outpatient medications on file.  Review of Systems  Constitutional: Negative.   Respiratory: Negative.   Cardiovascular: Negative.   Gastrointestinal: Negative.   Musculoskeletal: Positive for arthralgias.  Neurological: Negative.     Social History   Tobacco Use  . Smoking status: Never Smoker  . Smokeless tobacco: Former Engineer, water Use Topics  . Alcohol use: Yes   Objective:   BP (!) 122/100 (BP Location: Left Arm, Patient Position: Sitting, Cuff Size: Large)   Pulse 90   Temp 98.4 F (36.9 C) (Oral)   Resp 16   Wt 214 lb (97.1 kg)   SpO2 98%   BMI 30.71 kg/m  Vitals:   04/17/18 1123  BP: (!) 122/100  Pulse: 90  Resp: 16  Temp: 98.4 F (36.9 C)  TempSrc: Oral  SpO2: 98%  Weight: 214 lb (97.1 kg)     Physical Exam  Constitutional: He appears well-developed and well-nourished. No distress.  HENT:  Head: Normocephalic and atraumatic.  Neck: Normal range of motion. Neck supple.  Cardiovascular: Normal rate, regular rhythm, normal heart sounds and intact distal pulses. Exam reveals no gallop and no friction rub.  No murmur heard. Pulmonary/Chest: Effort normal and breath sounds normal. No respiratory distress. He has no wheezes. He has no rales.  Musculoskeletal:       Left knee: He exhibits swelling. He exhibits normal range of motion, no effusion, no LCL  laxity, normal patellar mobility and no MCL laxity. Tenderness found. Medial joint line and lateral joint line tenderness noted.  Skin: He is not diaphoretic.  Vitals reviewed.      Assessment & Plan:     1. Primary osteoarthritis of left knee Steroid injection given to left knee, see procedure note below. After care instructions printed and given to patient. Patient needs to schedule physical as this was past due in march 2019.  - methylPREDNISolone acetate (DEPO-MEDROL) injection 80 mg  Procedure Note: Benefits, risks (including infection, tattooing, adipose dimpling, and tendon rupture) and alternatives were explained to the patient. All questions were sought and answered.  Patient agreed to continue and verbal consent was obtained.   An aspiration and steroid injection was performed on left knee using 4cc of 1% plain Xyloocaine and 80 mg of depo-medrol. There was minimal bleeding. Hemostasis was intact. A dry dressing was applied. The procedure was well tolerated.      Margaretann Loveless, PA-C  [facility]

## 2018-04-17 NOTE — Patient Instructions (Signed)
Knee Injection, Care After  Refer to this sheet in the next few weeks. These instructions provide you with information about caring for yourself after your procedure. Your health care provider may also give you more specific instructions. Your treatment has been planned according to current medical practices, but problems sometimes occur. Call your health care provider if you have any problems or questions after your procedure.  What can I expect after the procedure?  After the procedure, it is common to have:   Soreness.   Warmth.   Swelling.    You may have more pain, swelling, and warmth than you did before the injection. This reaction may last for about one day.  Follow these instructions at home:  Bathing   If you were given a bandage (dressing), keep it dry until your health care provider says it can be removed. Ask your health care provider when you can start showering or taking a bath.  Managing pain, stiffness, and swelling   If directed, apply ice to the injection area:  ? Put ice in a plastic bag.  ? Place a towel between your skin and the bag.  ? Leave the ice on for 20 minutes, 2-3 times per day.   Do not apply heat to your knee.   Raise the injection area above the level of your heart while you are sitting or lying down.  Activity   Avoid strenuous activities for as long as directed by your health care provider. Ask your health care provider when you can return to your normal activities.  General instructions   Take medicines only as directed by your health care provider.   Do not take aspirin or other over-the-counter medicines unless your health care provider says you can.   Check your injection site every day for signs of infection. Watch for:  ? Redness, swelling, or pain.  ? Fluid, blood, or pus.   Follow your health care provider's instructions about dressing changes and removal.  Contact a health care provider if:   You have symptoms at your injection site that last longer than  two days after your procedure.   You have redness, swelling, or pain in your injection area.   You have fluid, blood, or pus coming from your injection site.   You have warmth in your injection area.   You have a fever.   Your pain is not controlled with medicine.  Get help right away if:   Your knee turns very red.   Your knee becomes very swollen.   Your knee pain is severe.  This information is not intended to replace advice given to you by your health care provider. Make sure you discuss any questions you have with your health care provider.  Document Released: 11/27/2014 Document Revised: 07/12/2016 Document Reviewed: 09/16/2014  [employer]vier Interactive Patient Education  2018 [employer]

## 2018-08-09 ENCOUNTER — Ambulatory Visit (INDEPENDENT_AMBULATORY_CARE_PROVIDER_SITE_OTHER): Payer: 59 | Admitting: Physician Assistant

## 2018-08-09 ENCOUNTER — Encounter: Payer: Self-pay | Admitting: Physician Assistant

## 2018-08-09 VITALS — BP 140/90 | HR 94 | Temp 98.7°F | Resp 16 | Wt 203.0 lb

## 2018-08-09 DIAGNOSIS — M1712 Unilateral primary osteoarthritis, left knee: Secondary | ICD-10-CM | POA: Diagnosis not present

## 2018-08-09 MED ORDER — METHYLPREDNISOLONE ACETATE 80 MG/ML IJ SUSP
80.0000 mg | Freq: Once | INTRAMUSCULAR | Status: AC
  Administered 2018-08-09: 80 mg via INTRA_ARTICULAR

## 2018-08-09 NOTE — Progress Notes (Signed)
       Patient: Bradley Carroll Male    DOB: 03/25/1971   47 y.o.   MRN: [id number] Visit Date: 08/09/2018  Today's Provider: Margaretann LovelessJennifer M Athziry Millican, PA-C   Chief Complaint  Patient presents with  . Knee Pain   Subjective:    HPI Patient here today C/O left knee pain on and off x's several weeks. He has known OA in the left knee. Had a steroid injection in his knee in May 2019 with good response. Reports recently had a shift in position at work and is having to do a lot more pushing of heavy items and lifting. He feels this is what aggravated his knee and is requesting another steroid injection.     Allergies  Allergen Reactions  . Other Other (See Comments)    Shellfish-upset stomach.    No current outpatient medications on file.  Review of Systems  Constitutional: Negative.   Cardiovascular: Negative.   Musculoskeletal: Positive for arthralgias and myalgias.    Social History   Tobacco Use  . Smoking status: Never Smoker  . Smokeless tobacco: Former Engineer, waterUser  Substance Use Topics  . Alcohol use: Yes   Objective:   BP 140/90 (BP Location: Left Arm, Patient Position: Sitting, Cuff Size: Large)   Pulse 94   Temp 98.7 F (37.1 C) (Oral)   Resp 16   Wt 203 lb (92.1 kg)   SpO2 97%   BMI 29.13 kg/m  Vitals:   08/09/18 1552  BP: 140/90  Pulse: 94  Resp: 16  Temp: 98.7 F (37.1 C)  TempSrc: Oral  SpO2: 97%  Weight: 203 lb (92.1 kg)     Physical Exam  Constitutional: He appears well-developed and well-nourished. No distress.  HENT:  Head: Normocephalic and atraumatic.  Neck: Normal range of motion. Neck supple.  Cardiovascular: Normal rate, regular rhythm and normal heart sounds. Exam reveals no gallop and no friction rub.  No murmur heard. Pulmonary/Chest: Effort normal and breath sounds normal. No respiratory distress. He has no wheezes. He has no rales.  Musculoskeletal:       Right knee: Normal.       Left knee: He exhibits swelling. He exhibits normal range  of motion, no effusion, no erythema, normal alignment, no LCL laxity, normal patellar mobility, no bony tenderness, normal meniscus and no MCL laxity. Tenderness found. Medial joint line tenderness noted.  Skin: He is not diaphoretic.  Vitals reviewed.      Assessment & Plan:     1. Primary osteoarthritis of left knee Steroid injection given today in the office, see procedure note below. Call if symptoms worsen. - methylPREDNISolone acetate (DEPO-MEDROL) injection 80 mg  Procedure Note: Benefits, risks (including infection, tattooing, adipose dimpling, and tendon rupture) and alternatives were explained to the patient. All questions were sought and answered.  Patient agreed to continue and verbal consent was obtained.   An aspiration and steroid injection was performed on left knee using 4cc of 1% plain Xyloocaine and 80 mg of depo-medrol. There was minimal bleeding. Hemostasis was intact. A dry dressing was applied. The procedure was well tolerated.      Margaretann LovelessJennifer M Sherri Mcarthy, PA-C  [facility]

## 2018-08-14 ENCOUNTER — Encounter: Payer: Self-pay | Admitting: Physician Assistant

## 2018-10-24 ENCOUNTER — Other Ambulatory Visit: Payer: Self-pay | Admitting: Physician Assistant

## 2018-10-24 DIAGNOSIS — L299 Pruritus, unspecified: Secondary | ICD-10-CM

## 2018-10-24 NOTE — Telephone Encounter (Signed)
Pt needing   Ammonia Lactate 12%  Called into:  [facility] [id number] [place], [place] - [address] [phone] (Phone) [phone] (Fax)   Thanks, [facility]

## 2018-10-25 MED ORDER — AMMONIUM LACTATE 12 % EX CREA: TOPICAL_CREAM | CUTANEOUS | 0 refills | Status: DC | PRN

## 2018-12-27 ENCOUNTER — Ambulatory Visit (INDEPENDENT_AMBULATORY_CARE_PROVIDER_SITE_OTHER): Payer: Managed Care, Other (non HMO) | Admitting: Physician Assistant

## 2018-12-27 ENCOUNTER — Encounter: Payer: Self-pay | Admitting: Physician Assistant

## 2018-12-27 VITALS — BP 132/90 | HR 97 | Temp 98.2°F | Resp 16 | Wt 219.6 lb

## 2018-12-27 DIAGNOSIS — M1711 Unilateral primary osteoarthritis, right knee: Secondary | ICD-10-CM

## 2018-12-27 DIAGNOSIS — M1712 Unilateral primary osteoarthritis, left knee: Secondary | ICD-10-CM | POA: Diagnosis not present

## 2018-12-27 MED ORDER — METHYLPREDNISOLONE ACETATE 40 MG/ML IJ SUSP
40.0000 mg | Freq: Once | INTRAMUSCULAR | Status: AC
  Administered 2018-12-27: 40 mg via INTRA_ARTICULAR

## 2018-12-27 NOTE — Progress Notes (Signed)
Patient: Bradley Carroll Male    DOB: 1971-02-12   48 y.o.   MRN: [id number] Visit Date: 12/27/2018  Today's Provider: Margaretann Loveless, PA-C   Chief Complaint  Patient presents with  . Knee Pain   Subjective:     Knee Pain   The incident occurred more than 1 week ago. The pain is present in the left knee and right knee. The quality of the pain is described as aching and stabbing. Pain scale: with walking a lot 7-8/10. The pain is moderate. The pain has been constant since onset. Pertinent negatives include no inability to bear weight, loss of motion, muscle weakness, numbness or tingling. He reports no foreign bodies present. He has tried NSAIDs (IBU) for the symptoms. The treatment provided no relief.      Allergies  Allergen Reactions  . Other Other (See Comments)    Shellfish-upset stomach.     Current Outpatient Medications:  .  ammonium lactate (AMLACTIN) 12 % cream, Apply topically as needed for dry skin., Disp: 385 g, Rfl: 0  Review of Systems  Constitutional: Negative.   Respiratory: Negative.   Musculoskeletal: Positive for arthralgias, gait problem and joint swelling.  Neurological: Negative for tingling, weakness and numbness.    Social History   Tobacco Use  . Smoking status: Never Smoker  . Smokeless tobacco: Former Engineer, water Use Topics  . Alcohol use: Yes      Objective:   BP 132/90 (BP Location: Left Arm, Patient Position: Sitting, Cuff Size: Large)   Pulse 97   Temp 98.2 F (36.8 C) (Oral)   Resp 16   Wt 219 lb 9.6 oz (99.6 kg)   BMI 31.51 kg/m  Vitals:   12/27/18 1455  BP: 132/90  Pulse: 97  Resp: 16  Temp: 98.2 F (36.8 C)  TempSrc: Oral  Weight: 219 lb 9.6 oz (99.6 kg)     Physical Exam Vitals signs reviewed.  Constitutional:      General: He is not in acute distress.    Appearance: He is well-developed and normal weight. He is not diaphoretic.  HENT:     Head: Normocephalic and atraumatic.  Neck:   Musculoskeletal: Normal range of motion and neck supple.  Cardiovascular:     Rate and Rhythm: Normal rate and regular rhythm.     Heart sounds: Normal heart sounds. No murmur. No friction rub. No gallop.   Pulmonary:     Effort: Pulmonary effort is normal. No respiratory distress.     Breath sounds: Normal breath sounds. No wheezing or rales.  Musculoskeletal:     Right knee: He exhibits deformity (valgus deformity). He exhibits normal range of motion, no swelling, no erythema, no LCL laxity, normal patellar mobility, no bony tenderness and no MCL laxity. Tenderness found. Medial joint line tenderness noted.     Left knee: He exhibits swelling and deformity (valgus deformity). He exhibits normal range of motion, no erythema, normal alignment, no LCL laxity, normal patellar mobility, no bony tenderness, normal meniscus and no MCL laxity. Tenderness found. Medial joint line tenderness noted.     Comments: Positive patellar grind on right knee Negative varus/valgus, lachman, ant/post drawer and mcmurray Left knee h/o meniscal repair  Neurological:     Mental Status: He is alert.         Assessment & Plan    1. Primary osteoarthritis of right knee Steroid injections given bilaterally, see procedure note below. Recommend imaging of right knee  since new. Referral also placed to orthopedics as below due to continued symptoms. Call if worsening.  - DG Knee 4 Views W/Patella Right; Future - methylPREDNISolone acetate (DEPO-MEDROL) injection 40 mg - Ambulatory referral to Orthopedic Surgery  2. Primary osteoarthritis of left knee Chronic. H/O meniscal repair.  - methylPREDNISolone acetate (DEPO-MEDROL) injection 40 mg - Ambulatory referral to Orthopedic Surgery  Procedure Note: Benefits, risks (including infection, tattooing, adipose dimpling, and tendon rupture) and alternatives were explained to the patient. All questions were sought and answered.  Patient agreed to continue and verbal  consent was obtained.   An aspiration and steroid injection was performed on right and left knee using 4cc of 1% plain Xyloocaine and 40 mg of depo-medrol each. There was minimal bleeding. Hemostasis was intact. A dry dressing was applied. The procedure was well tolerated.    Margaretann LovelessJennifer M Rahaf Carbonell, PA-C  [facility]

## 2018-12-27 NOTE — Patient Instructions (Signed)
Knee Injection  A knee injection is a procedure to get medicine into your knee joint to relieve the pain, swelling, and stiffness of arthritis. Your health care provider uses a needle to inject medicine, which may also help to lubricate and cushion your knee joint. You may need more than one injection.  Tell a health care provider about:  Any allergies you have.  All medicines you are taking, including vitamins, herbs, eye drops, creams, and over-the-counter medicines.  Any problems you or family members have had with anesthetic medicines.  Any blood disorders you have.  Any surgeries you have had.  Any medical conditions you have.  Whether you are pregnant or may be pregnant.  What are the risks?  Generally, this is a safe procedure. However, problems may occur, including:  Infection.  Bleeding.  Symptoms that get worse.  Damage to the area around your knee.  Allergic reaction to any of the medicines.  Skin reactions from repeated injections.  What happens before the procedure?  Ask your health care provider about changing or stopping your regular medicines. This is especially important if you are taking diabetes medicines or blood thinners.  Plan to have someone take you home from the hospital or clinic.  What happens during the procedure?    You will sit or lie down in a position for your knee to be treated.  The skin over your kneecap will be cleaned with a germ-killing soap.  You will be given a medicine that numbs the area (local anesthetic). You may feel some stinging.  The medicine will be injected into your knee. The needle is carefully placed between your kneecap and your knee. The medicine is injected into the joint space.  The needle will be removed at the end of the procedure.  A bandage (dressing) may be placed over the injection site.  The procedure may vary among health care providers and hospitals.  What can I expect after the procedure?  Your blood pressure, heart rate, breathing rate, and blood  oxygen level will be monitored until you leave the hospital or clinic.  You may have to move your knee through its full range of motion. This helps to get all the medicine into your joint space.  You will be watched to make sure that you do not have a reaction to the injected medicine.  You may feel more pain, swelling, and warmth than you did before the injection. This reaction may last about 1-2 days.  Follow these instructions at home:  Medicines  Take over-the-counter and prescription medicines only as told by your doctor.  Do not drive or use heavy machinery while taking prescription pain medicine.  Do not take medicines such as aspirin and ibuprofen unless your health care provider tells you to take them.  Injection site care  Follow instructions from your health care provider about:  How to take care of your puncture site.  When and how you should change your dressing.  When you should remove your dressing.  Check your injection area every day for signs of infection. Check for:  More redness, swelling, or pain after 2 days.  Fluid or blood.  Pus or a bad smell.  Warmth.  Managing pain, stiffness, and swelling    If directed, put ice on the injection area:  Put ice in a plastic bag.  Place a towel between your skin and the bag.  Leave the ice on for 20 minutes, 2-3 times per day.  Do not   apply heat to your knee.  Raise (elevate) the injection area above the level of your heart while you are sitting or lying down.  General instructions  If you were given a dressing, keep it dry until your health care provider says it can be removed. Ask your health care provider when you can start showering or taking a bath.  Avoid strenuous activities for as long as directed by your health care provider. Ask your health care provider when you can return to your normal activities.  Keep all follow-up visits as told by your health care provider. This is important. You may need more injections.  Contact a health care provider if  you have:  A fever.  Warmth in your injection area.  Fluid, blood, or pus coming from your injection site.  Symptoms at your injection site that last longer than 2 days after your procedure.  Get help right away if:  Your knee:  Turns very red.  Becomes very swollen.  Is in severe pain.  Summary  A knee injection is a procedure to get medicine into your knee joint to relieve the pain, swelling, and stiffness of arthritis.  A needle is carefully placed between your kneecap and your knee to inject medicine into the joint space.  Before the procedure, ask your health care provider about changing or stopping your regular medicines, especially if you are taking diabetes medicines or blood thinners.  Contact your health care provider if you have any problems or questions after your procedure.  This information is not intended to replace advice given to you by your health care provider. Make sure you discuss any questions you have with your health care provider.  Document Released: 01/28/2007 Document Revised: 11/26/2017 Document Reviewed: 11/26/2017  Elsevier Interactive Patient Education  2019 [employer]

## 2019-01-04 ENCOUNTER — Other Ambulatory Visit: Payer: Self-pay | Admitting: Physician Assistant

## 2019-01-04 DIAGNOSIS — L299 Pruritus, unspecified: Secondary | ICD-10-CM

## 2019-01-09 DIAGNOSIS — M179 Osteoarthritis of knee, unspecified: Secondary | ICD-10-CM | POA: Insufficient documentation

## 2019-01-09 DIAGNOSIS — M1710 Unilateral primary osteoarthritis, unspecified knee: Secondary | ICD-10-CM | POA: Insufficient documentation

## 2019-05-27 ENCOUNTER — Other Ambulatory Visit: Payer: Self-pay | Admitting: Physician Assistant

## 2019-05-27 DIAGNOSIS — L299 Pruritus, unspecified: Secondary | ICD-10-CM

## 2019-08-06 ENCOUNTER — Ambulatory Visit: Payer: Managed Care, Other (non HMO) | Admitting: Physician Assistant

## 2019-08-07 ENCOUNTER — Other Ambulatory Visit: Payer: Self-pay | Admitting: Physician Assistant

## 2019-08-07 DIAGNOSIS — L299 Pruritus, unspecified: Secondary | ICD-10-CM

## 2019-10-27 ENCOUNTER — Other Ambulatory Visit: Payer: Self-pay | Admitting: Physician Assistant

## 2019-10-27 DIAGNOSIS — L299 Pruritus, unspecified: Secondary | ICD-10-CM

## 2019-11-05 ENCOUNTER — Encounter: Payer: Self-pay | Admitting: Physician Assistant

## 2019-11-05 ENCOUNTER — Other Ambulatory Visit: Payer: Self-pay

## 2019-11-05 ENCOUNTER — Ambulatory Visit (INDEPENDENT_AMBULATORY_CARE_PROVIDER_SITE_OTHER): Payer: Managed Care, Other (non HMO) | Admitting: Physician Assistant

## 2019-11-05 VITALS — BP 132/84 | HR 101 | Temp 97.3°F | Resp 16 | Ht 70.0 in | Wt 217.6 lb

## 2019-11-05 DIAGNOSIS — Z114 Encounter for screening for human immunodeficiency virus [HIV]: Secondary | ICD-10-CM

## 2019-11-05 DIAGNOSIS — Z125 Encounter for screening for malignant neoplasm of prostate: Secondary | ICD-10-CM | POA: Diagnosis not present

## 2019-11-05 DIAGNOSIS — E6609 Other obesity due to excess calories: Secondary | ICD-10-CM

## 2019-11-05 DIAGNOSIS — E119 Type 2 diabetes mellitus without complications: Secondary | ICD-10-CM

## 2019-11-05 DIAGNOSIS — Z0000 Encounter for general adult medical examination without abnormal findings: Secondary | ICD-10-CM

## 2019-11-05 DIAGNOSIS — Z23 Encounter for immunization: Secondary | ICD-10-CM | POA: Diagnosis not present

## 2019-11-05 DIAGNOSIS — Z1211 Encounter for screening for malignant neoplasm of colon: Secondary | ICD-10-CM

## 2019-11-05 DIAGNOSIS — Z6831 Body mass index (BMI) 31.0-31.9, adult: Secondary | ICD-10-CM

## 2019-11-05 DIAGNOSIS — E781 Pure hyperglyceridemia: Secondary | ICD-10-CM

## 2019-11-05 LAB — POCT UA - MICROALBUMIN: Microalbumin Ur, POC: 20 mg/L

## 2019-11-05 NOTE — Progress Notes (Signed)
Patient: Bradley Carroll, Male    DOB: 21-Feb-1971, 48 y.o.   MRN: [id number] Visit Date: 11/05/2019  Today's Provider: Margaretann Loveless, PA-C   Chief Complaint  Patient presents with  . Annual Exam   Subjective:     Annual physical exam Bradley Carroll is a 48 y.o. male who presents today for health maintenance and complete physical. He feels well. He reports exercising, walks all day at work. He reports he is sleeping fairly well. -----------------------------------------------------------------   Review of Systems  Constitutional: Negative.   HENT: Negative.   Eyes: Negative.   Respiratory: Negative.   Cardiovascular: Negative.   Gastrointestinal: Negative.   Endocrine: Negative.   Genitourinary: Negative.   Musculoskeletal: Negative.   Skin: Negative.   Allergic/Immunologic: Negative.   Neurological: Negative.   Hematological: Negative.   Psychiatric/Behavioral: Negative.     Social History      He  reports that he has never smoked. He has quit using smokeless tobacco. He reports current alcohol use. He reports that he does not use drugs.       Social History   Socioeconomic History  . Marital status: Single    Spouse name: Not on file  . Number of children: Not on file  . Years of education: Not on file  . Highest education level: Not on file  Occupational History  . Not on file  Tobacco Use  . Smoking status: Never Smoker  . Smokeless tobacco: Former Engineer, water and Sexual Activity  . Alcohol use: Yes  . Drug use: No  . Sexual activity: Not on file  Other Topics Concern  . Not on file  Social History Narrative   Lives in [place] with wife and 2 kids.  Works as Acupuncturist.  Likes golf, basketball   Social Determinants of Health   Financial Resource Strain:   . Difficulty of Paying Living Expenses: Not on file  Food Insecurity:   . Worried About Programme researcher, broadcasting/film/video in the Last Year: Not on file  . Ran Out of Food in the Last Year:  Not on file  Transportation Needs:   . Lack of Transportation (Medical): Not on file  . Lack of Transportation (Non-Medical): Not on file  Physical Activity:   . Days of Exercise per Week: Not on file  . Minutes of Exercise per Session: Not on file  Stress:   . Feeling of Stress : Not on file  Social Connections:   . Frequency of Communication with Friends and Family: Not on file  . Frequency of Social Gatherings with Friends and Family: Not on file  . Attends Religious Services: Not on file  . Active Member of Clubs or Organizations: Not on file  . Attends Banker Meetings: Not on file  . Marital Status: Not on file    Past Medical History:  Diagnosis Date  . Allergy   . Asthma      Patient Active Problem List   Diagnosis Date Noted  . Visit for well man health check 12/15/2015  . Diabetes mellitus, new onset (HCC) 12/03/2013  . HYPERTRIGLYCERIDEMIA 10/25/2010  . ALLERGIC RHINITIS 10/08/2009  . Asthma 10/08/2009  . Asteatotic eczema 10/08/2009    Past Surgical History:  Procedure Laterality Date  . KNEE ARTHROSCOPY W/ PARTIAL MEDIAL MENISCECTOMY  2005    Family History        Family Status  Relation Name Status  . Father  (Not Specified)  CHF  . Mother  (Not Specified)        His family history includes Diabetes in his father and mother; Hypertension in his father.      Allergies  Allergen Reactions  . Other Other (See Comments)    Shellfish-upset stomach.     Current Outpatient Medications:  .  ammonium lactate (AMLACTIN) 12 % cream, APPLY TOPICALLY AS NEEDED FOR DRY SKIN., Disp: 385 g, Rfl: 1 .  meloxicam (MOBIC) 15 MG tablet, meloxicam  15 mg tabs, Disp: , Rfl:    Patient Care Team: Reine JustBurnette, Shawana Knoch M, PA-C as PCP - General (Family Medicine)    Objective:    Vitals: BP 132/84 (BP Location: Left Arm, Patient Position: Sitting, Cuff Size: Large)   Pulse (!) 101   Temp (!) 97.3 F (36.3 C) (Temporal)   Resp 16   Ht 5\' 10"   (1.778 m)   Wt 217 lb 9.6 oz (98.7 kg)   BMI 31.22 kg/m    Vitals:   11/05/19 1413  BP: 132/84  Pulse: (!) 101  Resp: 16  Temp: (!) 97.3 F (36.3 C)  TempSrc: Temporal  Weight: 217 lb 9.6 oz (98.7 kg)  Height: 5\' 10"  (1.778 m)     Physical Exam Vitals reviewed.  Constitutional:      General: He is not in acute distress.    Appearance: Normal appearance. He is well-developed. He is obese. He is not ill-appearing.  HENT:     Head: Normocephalic and atraumatic.     Right Ear: Tympanic membrane, ear canal and external ear normal.     Left Ear: Tympanic membrane, ear canal and external ear normal.  Eyes:     General: No scleral icterus.       Right eye: No discharge.        Left eye: No discharge.     Extraocular Movements: Extraocular movements intact.     Conjunctiva/sclera: Conjunctivae normal.     Pupils: Pupils are equal, round, and reactive to light.  Neck:     Thyroid: No thyromegaly.     Trachea: No tracheal deviation.  Cardiovascular:     Rate and Rhythm: Regular rhythm. Tachycardia present.     Pulses: Normal pulses.     Heart sounds: Normal heart sounds. No murmur.  Pulmonary:     Effort: Pulmonary effort is normal. No respiratory distress.     Breath sounds: Normal breath sounds. No wheezing or rales.  Chest:     Chest wall: No tenderness.  Abdominal:     General: Abdomen is flat. Bowel sounds are normal. There is no distension.     Palpations: Abdomen is soft. There is no mass.     Tenderness: There is no abdominal tenderness. There is no guarding or rebound.  Musculoskeletal:        General: No tenderness. Normal range of motion.     Cervical back: Normal range of motion and neck supple.     Right lower leg: No edema.     Left lower leg: No edema.  Lymphadenopathy:     Cervical: No cervical adenopathy.  Skin:    General: Skin is warm and dry.     Capillary Refill: Capillary refill takes less than 2 seconds.     Findings: No erythema or rash.   Neurological:     General: No focal deficit present.     Mental Status: He is alert and oriented to person, place, and time. Mental status is at baseline.  Cranial Nerves: No cranial nerve deficit.     Motor: No abnormal muscle tone.     Coordination: Coordination normal.     Deep Tendon Reflexes: Reflexes are normal and symmetric. Reflexes normal.  Psychiatric:        Mood and Affect: Mood normal.        Behavior: Behavior normal.        Thought Content: Thought content normal.        Judgment: Judgment normal.      Depression Screen PHQ 2/9 Scores 11/05/2019 09/26/2017  PHQ - 2 Score 0 0       Assessment & Plan:     Routine Health Maintenance and Physical Exam  Exercise Activities and Dietary recommendations Goals   None     Immunization History  Administered Date(s) Administered  . Influenza, Seasonal, Injecte, Preservative Fre 08/20/2016  . Influenza,inj,Quad PF,6+ Mos 07/31/2013, 09/26/2017  . Influenza-Unspecified 11/26/2018  . Tdap 11/27/2012    Health Maintenance  Topic Date Due  . PNEUMOCOCCAL POLYSACCHARIDE VACCINE AGE 4-64 HIGH RISK  03/22/1975  . FOOT EXAM  03/22/1983  . HIV Screening  03/21/1988  . OPHTHALMOLOGY EXAM  12/26/2014  . HEMOGLOBIN A1C  07/26/2017  . URINE MICROALBUMIN  01/23/2018  . INFLUENZA VACCINE  02/18/2020 (Originally 06/21/2019)  . TETANUS/TDAP  11/27/2022     Discussed health benefits of physical activity, and encouraged him to engage in regular exercise appropriate for his age and condition.    1. Annual physical exam Normal physical exam today. Will check labs as below and f/u pending lab results. If labs are stable and WNL he will not need to have these rechecked for one year at his next annual physical exam. He is to call the office in the meantime if he has any acute issue, questions or concerns. - CBC w/Diff - Comprehensive Metabolic Panel (CMET) - HgB A1c - Lipid Profile - TSH  2. Colon cancer screening No  previous colonoscopy. Referral placed as below.  - Ambulatory referral to Gastroenterology  3. Prostate cancer screening Father had h/o BPH. Currently no symptoms of obstruction. Will check labs as below and f/u pending results. - PSA  4. Diabetes mellitus, new onset ([place]) Diet controlled. Urine Microalbumin negative. Reports he had A1c with [facility] for biometric screening and A1c was around 6.3 he thinks. Will check labs as below and f/u pending results. - CBC w/Diff - Comprehensive Metabolic Panel (CMET) - HgB A1c - Pneumococcal polysaccharide vaccine 23-valent greater than or equal to 2yo subcutaneous/IM  5. HYPERTRIGLYCERIDEMIA H/O this. Diet controlled. Will check labs as below and f/u pending results. - CBC w/Diff - Comprehensive Metabolic Panel (CMET) - HgB A1c - Lipid Profile  6. Screening for HIV without presence of risk factors Will check labs as below and f/u pending results. - HIV antibody (with reflex)  7. Class 1 obesity due to excess calories with serious comorbidity and body mass index (BMI) of 31.0 to 31.9 in adult Counseled patient on healthy lifestyle modifications including dieting and exercise.   8. Need for 23-polyvalent pneumococcal polysaccharide vaccine Pneumococcal 23 Vaccine given to patient without complications. Patient sat for 15 minutes after administration and was tolerated well without adverse effects. - Pneumococcal polysaccharide vaccine 23-valent greater than or equal to 2yo subcutaneous/IM  --------------------------------------------------------------------    [name], PA-C  [facility]

## 2019-11-05 NOTE — Patient Instructions (Signed)

## 2019-11-06 ENCOUNTER — Encounter: Payer: Self-pay | Admitting: Physician Assistant

## 2019-11-06 DIAGNOSIS — E782 Mixed hyperlipidemia: Secondary | ICD-10-CM

## 2019-11-06 DIAGNOSIS — E1129 Type 2 diabetes mellitus with other diabetic kidney complication: Secondary | ICD-10-CM

## 2019-11-06 LAB — CBC WITH DIFFERENTIAL/PLATELET
Basophils Absolute: 0 10*3/uL (ref 0.0–0.2)
Basos: 1 %
EOS (ABSOLUTE): 0.1 10*3/uL (ref 0.0–0.4)
Eos: 2 %
Hematocrit: 47.9 % (ref 37.5–51.0)
Hemoglobin: 16.4 g/dL (ref 13.0–17.7)
Immature Grans (Abs): 0 10*3/uL (ref 0.0–0.1)
Immature Granulocytes: 1 %
Lymphocytes Absolute: 1.5 10*3/uL (ref 0.7–3.1)
Lymphs: 37 %
MCH: 30.4 pg (ref 26.6–33.0)
MCHC: 34.2 g/dL (ref 31.5–35.7)
MCV: 89 fL (ref 79–97)
Monocytes Absolute: 0.3 10*3/uL (ref 0.1–0.9)
Monocytes: 8 %
Neutrophils Absolute: 2.1 10*3/uL (ref 1.4–7.0)
Neutrophils: 51 %
Platelets: 207 10*3/uL (ref 150–450)
RBC: 5.39 x10E6/uL (ref 4.14–5.80)
RDW: 12.5 % (ref 11.6–15.4)
WBC: 4.1 10*3/uL (ref 3.4–10.8)

## 2019-11-06 LAB — COMPREHENSIVE METABOLIC PANEL
ALT: 28 IU/L (ref 0–44)
AST: 27 IU/L (ref 0–40)
Albumin/Globulin Ratio: 2.2 (ref 1.2–2.2)
Albumin: 4.8 g/dL (ref 4.0–5.0)
Alkaline Phosphatase: 90 IU/L (ref 39–117)
BUN/Creatinine Ratio: 19 (ref 9–20)
BUN: 19 mg/dL (ref 6–24)
Bilirubin Total: 0.5 mg/dL (ref 0.0–1.2)
CO2: 22 mmol/L (ref 20–29)
Calcium: 9.3 mg/dL (ref 8.7–10.2)
Chloride: 99 mmol/L (ref 96–106)
Creatinine, Ser: 1.01 mg/dL (ref 0.76–1.27)
GFR calc Af Amer: 103 mL/min/{1.73_m2} (ref >59)
GFR calc non Af Amer: 89 mL/min/{1.73_m2} (ref >59)
Globulin, Total: 2.2 g/dL (ref 1.5–4.5)
Glucose: 213 mg/dL — ABNORMAL HIGH (ref 65–99)
Potassium: 4.5 mmol/L (ref 3.5–5.2)
Sodium: 136 mmol/L (ref 134–144)
Total Protein: 7 g/dL (ref 6.0–8.5)

## 2019-11-06 LAB — LIPID PANEL
Chol/HDL Ratio: 5.4 ratio — ABNORMAL HIGH (ref 0.0–5.0)
Cholesterol, Total: 249 mg/dL — ABNORMAL HIGH (ref 100–199)
HDL: 46 mg/dL (ref >39)
Triglycerides: 982 mg/dL (ref 0–149)

## 2019-11-06 LAB — PSA: Prostate Specific Ag, Serum: 1.4 ng/mL (ref 0.0–4.0)

## 2019-11-06 LAB — HEMOGLOBIN A1C
Est. average glucose Bld gHb Est-mCnc: 163 mg/dL
Hgb A1c MFr Bld: 7.3 % — ABNORMAL HIGH (ref 4.8–5.6)

## 2019-11-06 LAB — TSH: TSH: 0.582 u[IU]/mL (ref 0.450–4.500)

## 2019-11-06 LAB — ABO AND RH: Rh Factor: POSITIVE

## 2019-11-06 LAB — HIV ANTIBODY (ROUTINE TESTING W REFLEX): HIV Screen 4th Generation wRfx: NONREACTIVE

## 2019-11-06 MED ORDER — ATORVASTATIN CALCIUM 20 MG PO TABS: 20.0000 mg | ORAL_TABLET | Freq: Every day | ORAL | 3 refills | Status: DC

## 2019-11-06 MED ORDER — METFORMIN HCL 500 MG PO TABS: 500.0000 mg | ORAL_TABLET | Freq: Two times a day (BID) | ORAL | 3 refills | Status: DC

## 2019-11-26 ENCOUNTER — Other Ambulatory Visit: Payer: Self-pay

## 2019-11-26 ENCOUNTER — Telehealth: Payer: Self-pay

## 2019-11-26 DIAGNOSIS — Z1211 Encounter for screening for malignant neoplasm of colon: Secondary | ICD-10-CM

## 2019-11-26 NOTE — Telephone Encounter (Signed)
Gastroenterology Pre-Procedure Review  Request Date: 12/22/19 Requesting Physician: Dr. Allegra Lai  PATIENT REVIEW QUESTIONS: The patient responded to the following health history questions as indicated:    1. Are you having any GI issues? no 2. Do you have a personal history of Polyps? no 3. Do you have a family history of Colon Cancer or Polyps? no 4. Diabetes Mellitus? no 5. Joint replacements in the past 12 months?no 6. Major health problems in the past 3 months?no 7. Any artificial heart valves, MVP, or defibrillator?no    MEDICATIONS & ALLERGIES:    Patient reports the following regarding taking any anticoagulation/antiplatelet therapy:   Plavix, Coumadin, Eliquis, Xarelto, Lovenox, Pradaxa, Brilinta, or Effient? no Aspirin? no  Patient confirms/reports the following medications:  Current Outpatient Medications  Medication Sig Dispense Refill  . ammonium lactate (AMLACTIN) 12 % cream APPLY TOPICALLY AS NEEDED FOR DRY SKIN. 385 g 1  . atorvastatin (LIPITOR) 20 MG tablet Take 1 tablet (20 mg total) by mouth daily. 90 tablet 3  . meloxicam (MOBIC) 15 MG tablet meloxicam  15 mg tabs    . metFORMIN (GLUCOPHAGE) 500 MG tablet Take 1 tablet (500 mg total) by mouth 2 (two) times daily with a meal. 180 tablet 3   No current facility-administered medications for this visit.    Patient confirms/reports the following allergies:  Allergies  Allergen Reactions  . Other Other (See Comments)    Shellfish-upset stomach.    No orders of the defined types were placed in this encounter.   AUTHORIZATION INFORMATION Primary Insurance: 1D#: Group #:  Secondary Insurance: 1D#: Group #:  SCHEDULE INFORMATION: Date: 12/22/19 Time: Location:[facility]

## 2019-12-03 ENCOUNTER — Telehealth: Payer: Self-pay

## 2019-12-03 NOTE — Telephone Encounter (Signed)
Called and gave patient the days Dr. Allegra Lai would be scoping in [place]. Patient states he would give [place] a call back to scheduled this at a later time.

## 2019-12-09 ENCOUNTER — Other Ambulatory Visit: Payer: Self-pay

## 2019-12-09 DIAGNOSIS — Z1211 Encounter for screening for malignant neoplasm of colon: Secondary | ICD-10-CM

## 2019-12-09 MED ORDER — NA SULFATE-K SULFATE-MG SULF 17.5-3.13-1.6 GM/177ML PO SOLN: 354.0000 mL | Freq: Once | ORAL | 0 refills | Status: AC

## 2019-12-22 ENCOUNTER — Ambulatory Visit: Admit: 2019-12-22 | Payer: Managed Care, Other (non HMO) | Admitting: Gastroenterology

## 2019-12-22 SURGERY — COLONOSCOPY WITH PROPOFOL
Anesthesia: General

## 2019-12-28 ENCOUNTER — Other Ambulatory Visit: Payer: Self-pay | Admitting: Physician Assistant

## 2019-12-28 DIAGNOSIS — L299 Pruritus, unspecified: Secondary | ICD-10-CM

## 2019-12-28 NOTE — Telephone Encounter (Signed)
Requested Prescriptions  Pending Prescriptions Disp Refills  . ammonium lactate (AMLACTIN) 12 % cream [Pharmacy Med Name: AMMONIUM LACTATE 12% CREAM] 385 g 1    Sig: APPLY TOPICALLY AS NEEDED FOR DRY SKIN.     Dermatology:  Other Failed - 12/28/2019  8:10 AM      Failed - Valid encounter within last 12 months    Recent Outpatient Visits          1 month ago Annual physical exam   [facility] [place], Alessandra Bevels, [place]   1 year ago Primary osteoarthritis of right knee   [facility] [place], Alessandra Bevels, [place]   1 year ago Primary osteoarthritis of left knee   [facility] [place], Alessandra Bevels, [place]   1 year ago Primary osteoarthritis of left knee   [facility] [place], Alessandra Bevels, [place]   2 years ago Establishing care with new doctor, encounter for   [facility], Alessandra Bevels, PA-C      Future Appointments            In 4 months Rosezetta Schlatter, Alessandra Bevels, PA-C [facility]

## 2020-02-09 ENCOUNTER — Encounter: Payer: Self-pay | Admitting: Gastroenterology

## 2020-02-09 ENCOUNTER — Other Ambulatory Visit: Payer: Self-pay

## 2020-02-11 NOTE — Anesthesia Preprocedure Evaluation (Addendum)
Anesthesia Evaluation  Patient identified by MRN, date of birth, ID band Patient awake    Reviewed: Allergy & Precautions, NPO status , Patient's Chart, lab work & pertinent test results  History of Anesthesia Complications Negative for: history of anesthetic complications  Airway Mallampati: II  TM Distance: >3 FB Neck ROM: Full    Dental   Pulmonary asthma , Current Smoker and Patient abstained from smoking.,    breath sounds clear to auscultation       Cardiovascular (-) angina(-) DOE  Rhythm:Regular Rate:Normal   HLD   Neuro/Psych    GI/Hepatic neg GERD  ,  Endo/Other  diabetes (Pre-DM)  Renal/GU      Musculoskeletal  (+) Arthritis , Osteoarthritis,    Abdominal (+) + obese (BMI 31),   Peds  Hematology   Anesthesia Other Findings   Reproductive/Obstetrics                            Anesthesia Physical Anesthesia Plan  ASA: II  Anesthesia Plan: General   Post-op Pain Management:    Induction: Intravenous  PONV Risk Score and Plan: 1 and Propofol infusion, TIVA and Treatment may vary due to age or medical condition  Airway Management Planned: Natural Airway and Nasal Cannula  Additional Equipment:   Intra-op Plan:   Post-operative Plan:   Informed Consent: I have reviewed the patients History and Physical, chart, labs and discussed the procedure including the risks, benefits and alternatives for the proposed anesthesia with the patient or authorized representative who has indicated his/her understanding and acceptance.       Plan Discussed with: CRNA and Anesthesiologist  Anesthesia Plan Comments:         Anesthesia Quick Evaluation

## 2020-02-12 ENCOUNTER — Other Ambulatory Visit
Admission: RE | Admit: 2020-02-12 | Discharge: 2020-02-12 | Disposition: A | Discharge status: Home / Self Care | Payer: Managed Care, Other (non HMO) | Source: Ambulatory Visit | Attending: Gastroenterology | Admitting: Gastroenterology

## 2020-02-12 ENCOUNTER — Other Ambulatory Visit: Payer: Self-pay

## 2020-02-12 DIAGNOSIS — Z20822 Contact with and (suspected) exposure to covid-19: Secondary | ICD-10-CM | POA: Insufficient documentation

## 2020-02-12 DIAGNOSIS — Z01812 Encounter for preprocedural laboratory examination: Secondary | ICD-10-CM | POA: Diagnosis not present

## 2020-02-12 LAB — SARS CORONAVIRUS 2 (TAT 6-24 HRS): SARS Coronavirus 2: NEGATIVE

## 2020-02-12 NOTE — Discharge Instructions (Signed)
General Anesthesia, Adult, Care After This sheet gives you information about how to care for yourself after your procedure. Your health care provider may also give you more specific instructions. If you have problems or questions, contact your health care provider. What can I expect after the procedure? After the procedure, the following side effects are common:  Pain or discomfort at the IV site.  Nausea.  Vomiting.  Sore throat.  Trouble concentrating.  Feeling cold or chills.  Weak or tired.  Sleepiness and fatigue.  Soreness and body aches. These side effects can affect parts of the body that were not involved in surgery. Follow these instructions at home:  For at least 24 hours after the procedure:  Have a responsible adult stay with you. It is important to have someone help care for you until you are awake and alert.  Rest as needed.  Do not: ? Participate in activities in which you could fall or become injured. ? Drive. ? Use heavy machinery. ? Drink alcohol. ? Take sleeping pills or medicines that cause drowsiness. ? Make important decisions or sign legal documents. ? Take care of children on your own. Eating and drinking  Follow any instructions from your health care provider about eating or drinking restrictions.  When you feel hungry, start by eating small amounts of foods that are soft and easy to digest (bland), such as toast. Gradually return to your regular diet.  Drink enough fluid to keep your urine pale yellow.  If you vomit, rehydrate by drinking water, juice, or clear broth. General instructions  If you have sleep apnea, surgery and certain medicines can increase your risk for breathing problems. Follow instructions from your health care provider about wearing your sleep device: ? Anytime you are sleeping, including during daytime naps. ? While taking prescription pain medicines, sleeping medicines, or medicines that make you drowsy.  Return to  your normal activities as told by your health care provider. Ask your health care provider what activities are safe for you.  Take over-the-counter and prescription medicines only as told by your health care provider.  If you smoke, do not smoke without supervision.  Keep all follow-up visits as told by your health care provider. This is important. Contact a health care provider if:  You have nausea or vomiting that does not get better with medicine.  You cannot eat or drink without vomiting.  You have pain that does not get better with medicine.  You are unable to pass urine.  You develop a skin rash.  You have a fever.  You have redness around your IV site that gets worse. Get help right away if:  You have difficulty breathing.  You have chest pain.  You have blood in your urine or stool, or you vomit blood. Summary  After the procedure, it is common to have a sore throat or nausea. It is also common to feel tired.  Have a responsible adult stay with you for the first 24 hours after general anesthesia. It is important to have someone help care for you until you are awake and alert.  When you feel hungry, start by eating small amounts of foods that are soft and easy to digest (bland), such as toast. Gradually return to your regular diet.  Drink enough fluid to keep your urine pale yellow.  Return to your normal activities as told by your health care provider. Ask your health care provider what activities are safe for you. This information is not   intended to replace advice given to you by your health care provider. Make sure you discuss any questions you have with your health care provider. Document Revised: 11/09/2017 Document Reviewed: 06/22/2017 Elsevier Patient Education  2020 Elsevier Inc.  

## 2020-02-16 ENCOUNTER — Ambulatory Visit: Payer: Managed Care, Other (non HMO) | Admitting: Anesthesiology

## 2020-02-16 ENCOUNTER — Ambulatory Visit
Admission: RE | Admit: 2020-02-16 | Discharge: 2020-02-16 | Disposition: A | Discharge status: Home / Self Care | Payer: Managed Care, Other (non HMO) | Attending: Gastroenterology | Admitting: Gastroenterology

## 2020-02-16 ENCOUNTER — Encounter: Payer: Self-pay | Admitting: Gastroenterology

## 2020-02-16 ENCOUNTER — Encounter
Admission: RE | Disposition: A | Discharge status: Home / Self Care | Payer: Self-pay | Source: Home / Self Care | Attending: Gastroenterology

## 2020-02-16 ENCOUNTER — Other Ambulatory Visit: Payer: Self-pay

## 2020-02-16 DIAGNOSIS — Z7984 Long term (current) use of oral hypoglycemic drugs: Secondary | ICD-10-CM | POA: Insufficient documentation

## 2020-02-16 DIAGNOSIS — Z833 Family history of diabetes mellitus: Secondary | ICD-10-CM | POA: Insufficient documentation

## 2020-02-16 DIAGNOSIS — F17210 Nicotine dependence, cigarettes, uncomplicated: Secondary | ICD-10-CM | POA: Insufficient documentation

## 2020-02-16 DIAGNOSIS — M170 Bilateral primary osteoarthritis of knee: Secondary | ICD-10-CM | POA: Diagnosis not present

## 2020-02-16 DIAGNOSIS — R7303 Prediabetes: Secondary | ICD-10-CM | POA: Diagnosis not present

## 2020-02-16 DIAGNOSIS — Z791 Long term (current) use of non-steroidal anti-inflammatories (NSAID): Secondary | ICD-10-CM | POA: Insufficient documentation

## 2020-02-16 DIAGNOSIS — Z1211 Encounter for screening for malignant neoplasm of colon: Secondary | ICD-10-CM | POA: Diagnosis not present

## 2020-02-16 DIAGNOSIS — J45909 Unspecified asthma, uncomplicated: Secondary | ICD-10-CM | POA: Diagnosis not present

## 2020-02-16 DIAGNOSIS — Z79899 Other long term (current) drug therapy: Secondary | ICD-10-CM | POA: Insufficient documentation

## 2020-02-16 HISTORY — DX: Prediabetes: R73.03

## 2020-02-16 HISTORY — DX: Unspecified osteoarthritis, unspecified site: M19.90

## 2020-02-16 HISTORY — PX: COLONOSCOPY WITH PROPOFOL: SHX5780

## 2020-02-16 LAB — GLUCOSE, CAPILLARY
Glucose-Capillary: 148 mg/dL — ABNORMAL HIGH (ref 70–99)
Glucose-Capillary: 150 mg/dL — ABNORMAL HIGH (ref 70–99)

## 2020-02-16 SURGERY — COLONOSCOPY WITH PROPOFOL
Anesthesia: General | Site: Rectum

## 2020-02-16 MED ORDER — ONDANSETRON HCL 4 MG/2ML IJ SOLN: 4.0000 mg | Freq: Once | INTRAMUSCULAR | Status: DC | PRN

## 2020-02-16 MED ORDER — PROPOFOL 10 MG/ML IV BOLUS
INTRAVENOUS | Status: DC | PRN
  Administered 2020-02-16 (×4): 100 mg via INTRAVENOUS

## 2020-02-16 MED ORDER — LIDOCAINE HCL (CARDIAC) PF 100 MG/5ML IV SOSY
PREFILLED_SYRINGE | INTRAVENOUS | Status: DC | PRN
  Administered 2020-02-16: 50 mg via INTRAVENOUS

## 2020-02-16 MED ORDER — LACTATED RINGERS IV SOLN
100.0000 mL/h | INTRAVENOUS | Status: DC
  Administered 2020-02-16: 100 mL/h via INTRAVENOUS

## 2020-02-16 MED ORDER — ACETAMINOPHEN 10 MG/ML IV SOLN: 1000.0000 mg | Freq: Once | INTRAVENOUS | Status: DC | PRN

## 2020-02-16 MED ORDER — STERILE WATER FOR IRRIGATION IR SOLN
Status: DC | PRN
  Administered 2020-02-16: 100 mL

## 2020-02-16 SURGICAL SUPPLY — 5 items
CANISTER SUCT 1200ML W/VALVE (MISCELLANEOUS) ×3 IMPLANT
GOWN CVR UNV OPN BCK APRN NK (MISCELLANEOUS) ×2 IMPLANT
GOWN ISOL THUMB LOOP REG UNIV (MISCELLANEOUS) ×4
KIT ENDO PROCEDURE OLY (KITS) ×3 IMPLANT
WATER STERILE IRR 250ML POUR (IV SOLUTION) ×3 IMPLANT

## 2020-02-16 NOTE — H&P (Signed)
Bradley Minium, MD [facility] [address] [place], [place] [address] Phone: [phone] Fax : [phone]  Primary Care Physician:  Margaretann Loveless, PA-C Primary Gastroenterologist:  Dr. Servando Snare  Pre-Procedure History & Physical: HPI:  Bradley Carroll is a 49 y.o. male is here for a screening colonoscopy.   Past Medical History:  Diagnosis Date  . Allergy   . Asthma   . Osteoarthritis    knees  . Pre-diabetes     Past Surgical History:  Procedure Laterality Date  . KNEE ARTHROSCOPY W/ PARTIAL MEDIAL MENISCECTOMY  2005    Prior to Admission medications   Medication Sig Start Date End Date Taking? Authorizing Provider  ammonium lactate (AMLACTIN) 12 % cream APPLY TOPICALLY AS NEEDED FOR DRY SKIN. 12/28/19  Yes Margaretann Loveless, PA-C  atorvastatin (LIPITOR) 20 MG tablet Take 1 tablet (20 mg total) by mouth daily. 11/06/19  Yes Joycelyn Man M, PA-C  metFORMIN (GLUCOPHAGE) 500 MG tablet Take 1 tablet (500 mg total) by mouth 2 (two) times daily with a meal. 11/06/19  Yes Burnette, Alessandra Bevels, PA-C  meloxicam (MOBIC) 15 MG tablet meloxicam  15 mg tabs    [provider]    Allergies as of 12/09/2019 - Review Complete 11/05/2019  Allergen Reaction Noted  . Other Other (See Comments) 09/26/2017    Family History  Problem Relation Age of Onset  . Diabetes Father   . Hypertension Father   . Diabetes Mother     Social History   Socioeconomic History  . Marital status: Single    Spouse name: Not on file  . Number of children: Not on file  . Years of education: Not on file  . Highest education level: Not on file  Occupational History  . Not on file  Tobacco Use  . Smoking status: Current Every Day Smoker    Packs/day: 0.10    Years: 4.00    Pack years: 0.40  . Smokeless tobacco: Former Engineer, water and Sexual Activity  . Alcohol use: Yes    Alcohol/week: 10.0 standard drinks    Types: 10 Cans of beer per week  . Drug use: No  . Sexual  activity: Not on file  Other Topics Concern  . Not on file  Social History Narrative   Lives in [place] with wife and 2 kids.  Works as Acupuncturist.  Likes golf, basketball   Social Determinants of Health   Financial Resource Strain:   . Difficulty of Paying Living Expenses:   Food Insecurity:   . Worried About Programme researcher, broadcasting/film/video in the Last Year:   . Barista in the Last Year:   Transportation Needs:   . Freight forwarder (Medical):   Marland Kitchen Lack of Transportation (Non-Medical):   Physical Activity:   . Days of Exercise per Week:   . Minutes of Exercise per Session:   Stress:   . Feeling of Stress :   Social Connections:   . Frequency of Communication with Friends and Family:   . Frequency of Social Gatherings with Friends and Family:   . Attends Religious Services:   . Active Member of Clubs or Organizations:   . Attends Banker Meetings:   Marland Kitchen Marital Status:   Intimate Partner Violence:   . Fear of Current or Ex-Partner:   . Emotionally Abused:   Marland Kitchen Physically Abused:   . Sexually Abused:     Review of Systems: See HPI, otherwise negative ROS  Physical Exam: BP (!) 130/100   Pulse 91   Temp 97.7 F (36.5 C) (Temporal)   Resp 16   Ht 5\' 10"  (1.778 m)   Wt 98.4 kg   SpO2 100%   BMI 31.13 kg/m  General:   Alert,  pleasant and cooperative in NAD Head:  Normocephalic and atraumatic. Neck:  Supple; no masses or thyromegaly. Lungs:  Clear throughout to auscultation.    Heart:  Regular rate and rhythm. Abdomen:  Soft, nontender and nondistended. Normal bowel sounds, without guarding, and without rebound.   Neurologic:  Alert and  oriented x4;  grossly normal neurologically.  Impression/Plan: [name] is now here to undergo a screening colonoscopy.  Risks, benefits, and alternatives regarding colonoscopy have been reviewed with the patient.  Questions have been answered.  All parties agreeable.

## 2020-02-16 NOTE — Op Note (Signed)
[facility]enter[facility] Patient Name: Bradley Carroll Procedure Date: 02/16/2020 7:40 AM MRN: [id number] Account #: [id number] Date of Birth: 1971-11-18 Admit Type: Outpatient Age: 49 Room: [facility] OR ROOM 01 Gender: Male Note Status: Finalized Procedure:             Colonoscopy Indications:           Screening for colorectal malignant neoplasm Providers:             [name] MD, MD Referring MD:          [name] (Referring MD) Medicines:             Propofol per Anesthesia Complications:         No immediate complications. Procedure:             Pre-Anesthesia Assessment:                        - Prior to the procedure, a History and Physical was                         performed, and patient medications and allergies were                         reviewed. The patient's tolerance of previous                         anesthesia was also reviewed. The risks and benefits                         of the procedure and the sedation options and risks                         were discussed with the patient. All questions were                         answered, and informed consent was obtained. Prior                         Anticoagulants: The patient has taken no previous                         anticoagulant or antiplatelet agents. ASA Grade                         Assessment: II - A patient with mild systemic disease.                         After reviewing the risks and benefits, the patient                         was deemed in satisfactory condition to undergo the                         procedure.                        After obtaining informed consent, the colonoscope was  passed under direct vision. Throughout the procedure,                         the patient's blood pressure, pulse, and oxygen                         saturations were monitored continuously. The was                         introduced through the anus and  advanced to the the                         cecum, identified by appendiceal orifice and ileocecal                         valve. The colonoscopy was performed without                         difficulty. The patient tolerated the procedure well.                         The quality of the bowel preparation was fair. Findings:      The perianal and digital rectal examinations were normal.      The colon (entire examined portion) appeared normal. Impression:            - Preparation of the colon was fair.                        - The entire examined colon is normal.                        - No specimens collected. Recommendation:        - Discharge patient to home.                        - Resume previous diet.                        - Continue present medications.                        - Repeat colonoscopy in 10 years for screening unless                         any change in family history or lower GI problems. Procedure Code(s):     --- Professional ---                        [phone], Colonoscopy, flexible; diagnostic, including                         collection of specimen(s) by brushing or washing, when                         performed (separate procedure) Diagnosis Code(s):     --- Professional ---                        Z12.11, Encounter for screening for malignant neoplasm  of colon CPT copyright 2019 American Medical Association. All rights reserved. The codes documented in this report are preliminary and upon coder review may  be revised to meet current compliance requirements. [name] MD, MD 02/16/2020 8:07:17 AM This report has been signed electronically. Number of Addenda: 0 Note Initiated On: 02/16/2020 7:40 AM Scope Withdrawal Time: 0 hours 7 minutes 17 seconds  Total Procedure Duration: 0 hours 9 minutes 20 seconds  Estimated Blood Loss:  Estimated blood loss: none.      [facility]

## 2020-02-16 NOTE — Transfer of Care (Signed)
Immediate Anesthesia Transfer of Care Note  Patient: Bradley Carroll  Procedure(s) Performed: COLONOSCOPY WITH PROPOFOL (N/A Rectum)  Patient Location: PACU  Anesthesia Type: General  Level of Consciousness: awake, alert  and patient cooperative  Airway and Oxygen Therapy: Patient Spontanous Breathing and Patient connected to supplemental oxygen  Post-op Assessment: Post-op Vital signs reviewed, Patient's Cardiovascular Status Stable, Respiratory Function Stable, Patent Airway and No signs of Nausea or vomiting  Post-op Vital Signs: Reviewed and stable  Complications: No apparent anesthesia complications

## 2020-02-16 NOTE — Anesthesia Postprocedure Evaluation (Signed)
Anesthesia Post Note  Patient: Bradley Carroll  Procedure(s) Performed: COLONOSCOPY WITH PROPOFOL (N/A Rectum)     Patient location during evaluation: PACU Anesthesia Type: General Level of consciousness: awake and alert Pain management: pain level controlled Vital Signs Assessment: post-procedure vital signs reviewed and stable Respiratory status: spontaneous breathing, nonlabored ventilation, respiratory function stable and patient connected to nasal cannula oxygen Cardiovascular status: blood pressure returned to baseline and stable Postop Assessment: no apparent nausea or vomiting Anesthetic complications: no    Bradley Carroll A  Haden Cavenaugh

## 2020-02-16 NOTE — Anesthesia Procedure Notes (Signed)
Procedure Name: General with mask airway Date/Time: 02/16/2020 7:57 AM Performed by: Jinny Blossom, CRNA Pre-anesthesia Checklist: Patient identified, Emergency Drugs available, Suction available, Patient being monitored and Timeout performed Patient Re-evaluated:Patient Re-evaluated prior to induction Oxygen Delivery Method: Nasal cannula Preoxygenation: Pre-oxygenation with 100% oxygen

## 2020-02-17 ENCOUNTER — Encounter: Payer: Self-pay | Admitting: *Deleted

## 2020-04-01 ENCOUNTER — Other Ambulatory Visit: Payer: Self-pay | Admitting: Physician Assistant

## 2020-04-01 DIAGNOSIS — L299 Pruritus, unspecified: Secondary | ICD-10-CM

## 2020-04-01 NOTE — Telephone Encounter (Signed)
Requested Prescriptions  Pending Prescriptions Disp Refills  . ammonium lactate (AMLACTIN) 12 % cream [Pharmacy Med Name: AMMONIUM LACTATE 12% CREAM] 385 g 1    Sig: APPLY TOPICALLY AS NEEDED FOR DRY SKIN.     Dermatology:  Other Failed - 04/01/2020 10:16 PM      Failed - Valid encounter within last 12 months    Recent Outpatient Visits          4 months ago Annual physical exam   [facility] [place], Alessandra Bevels, [place]   1 year ago Primary osteoarthritis of right knee   [facility] [place], Alessandra Bevels, [place]   1 year ago Primary osteoarthritis of left knee   [facility] [place], Alessandra Bevels, [place]   1 year ago Primary osteoarthritis of left knee   [facility] [place], Alessandra Bevels, [place]   2 years ago Establishing care with new doctor, encounter for   [facility], Alessandra Bevels, PA-C      Future Appointments            In 1 month Rosezetta Schlatter, Alessandra Bevels, PA-C [facility]           Valid encounter 4 months ago.

## 2020-05-05 NOTE — Progress Notes (Signed)
Established patient visit   Patient: Bradley Carroll   DOB: 21-Sep-1971   49 y.o. Male  MRN: [id number] Visit Date: 05/06/2020  Today's healthcare provider: Margaretann Loveless, PA-C   Chief Complaint  Patient presents with   Diabetes   Hyperlipidemia   Subjective    HPI Diabetes Mellitus Type II, Follow-up  Lab Results  Component Value Date   HGBA1C 8.1 (A) 05/06/2020   HGBA1C 7.3 (H) 11/05/2019   HGBA1C 6.7 (H) 01/23/2017   Wt Readings from Last 3 Encounters:  05/06/20 209 lb 12.8 oz (95.2 kg)  02/16/20 216 lb 14.9 oz (98.4 kg)  11/05/19 217 lb 9.6 oz (98.7 kg)   Last seen for diabetes 6 months ago.  Management since then includes start medication. Metformin 500 mg BID. He reports excellent compliance with treatment. He is not having side effects.  Symptoms: No fatigue No foot ulcerations  No appetite changes No nausea  No paresthesia of the feet  No polydipsia  No polyuria No visual disturbances   No vomiting     Home blood sugar records: fasting range: not being checked  Episodes of hypoglycemia? No    Current insulin regiment: none Most Recent Eye Exam: not done Current exercise: gardening, walking and yard work Current diet habits: in general, a "healthy" diet    Pertinent Labs: Lab Results  Component Value Date   CHOL 249 (H) 11/05/2019   HDL 46 11/05/2019   LDLCALC Comment (A) 11/05/2019   LDLDIRECT 81.0 11/12/2015   TRIG 982 (HH) 11/05/2019   CHOLHDL 5.4 (H) 11/05/2019   Lab Results  Component Value Date   NA 136 11/05/2019   K 4.5 11/05/2019   CREATININE 1.01 11/05/2019   GFRNONAA 89 11/05/2019   GFRAA 103 11/05/2019   GLUCOSE 213 (H) 11/05/2019     --------------------------------------------------------------------------------------------------- Lipid/Cholesterol, Follow-up  Last lipid panel Other pertinent labs  Lab Results  Component Value Date   CHOL 249 (H) 11/05/2019   HDL 46 11/05/2019   LDLCALC Comment (A) 11/05/2019     LDLDIRECT 81.0 11/12/2015   TRIG 982 (HH) 11/05/2019   CHOLHDL 5.4 (H) 11/05/2019   Lab Results  Component Value Date   ALT 28 11/05/2019   AST 27 11/05/2019   PLT 207 11/05/2019   TSH 0.582 11/05/2019     He was last seen for this 6 months ago.  Management since that visit includes start atorvastatin 20 mg daily.  He reports excellent compliance with treatment. He is not having side effects.   Symptoms: No chest pain No chest pressure/discomfort  No dyspnea No lower extremity edema  No numbness or tingling of extremity No orthopnea  No palpitations No paroxysmal nocturnal dyspnea  No speech difficulty No syncope   Current diet: in general, a "healthy" diet   Current exercise: gardening, walking and yard work  The 10-year ASCVD risk score Denman George DC Jr., et al., 2013) is: 17.5%  Meloxicam caused rectal bleeding ---------------------------------------------------------------------------------------------------   Patient Active Problem List   Diagnosis Date Noted   Special screening for malignant neoplasms, colon    Diabetes mellitus, new onset (HCC) 12/03/2013   HYPERTRIGLYCERIDEMIA 10/25/2010   ALLERGIC RHINITIS 10/08/2009   Asthma 10/08/2009   Asteatotic eczema 10/08/2009   Social History   Tobacco Use   Smoking status: Current Some Day Smoker    Packs/day: 0.10    Years: 4.00    Pack years: 0.40   Smokeless tobacco: Former Forensic psychologist Use:  Never used  Substance Use Topics   Alcohol use: Yes    Alcohol/week: 10.0 standard drinks    Types: 10 Cans of beer per week   Drug use: No       Medications: Outpatient Medications Prior to Visit  Medication Sig Note   ammonium lactate (AMLACTIN) 12 % cream APPLY TOPICALLY AS NEEDED FOR DRY SKIN.    [DISCONTINUED] atorvastatin (LIPITOR) 20 MG tablet Take 1 tablet (20 mg total) by mouth daily.    [DISCONTINUED] meloxicam (MOBIC) 15 MG tablet meloxicam  15 mg tabs 05/06/2020: rectal  bleeding   [DISCONTINUED] metFORMIN (GLUCOPHAGE) 500 MG tablet Take 1 tablet (500 mg total) by mouth 2 (two) times daily with a meal.    No facility-administered medications prior to visit.    Review of Systems  Constitutional: Negative.   Respiratory: Negative.   Cardiovascular: Negative.   Endocrine: Negative.   Neurological: Negative.   Psychiatric/Behavioral: Negative.     Last CBC Lab Results  Component Value Date   WBC 4.1 11/05/2019   HGB 16.4 11/05/2019   HCT 47.9 11/05/2019   MCV 89 11/05/2019   MCH 30.4 11/05/2019   RDW 12.5 11/05/2019   PLT 207 26/37/8588   Last metabolic panel Lab Results  Component Value Date   GLUCOSE 213 (H) 11/05/2019   NA 136 11/05/2019   K 4.5 11/05/2019   CL 99 11/05/2019   CO2 22 11/05/2019   BUN 19 11/05/2019   CREATININE 1.01 11/05/2019   GFRNONAA 89 11/05/2019   GFRAA 103 11/05/2019   CALCIUM 9.3 11/05/2019   PROT 7.0 11/05/2019   ALBUMIN 4.8 11/05/2019   LABGLOB 2.2 11/05/2019   AGRATIO 2.2 11/05/2019   BILITOT 0.5 11/05/2019   ALKPHOS 90 11/05/2019   AST 27 11/05/2019   ALT 28 11/05/2019   Last lipids Lab Results  Component Value Date   CHOL 249 (H) 11/05/2019   HDL 46 11/05/2019   LDLCALC Comment (A) 11/05/2019   LDLDIRECT 81.0 11/12/2015   TRIG 982 (HH) 11/05/2019   CHOLHDL 5.4 (H) 11/05/2019      Objective    BP (!) 127/92 (BP Location: Left Arm, Patient Position: Sitting, Cuff Size: Large)    Pulse 86    Temp (!) 97.1 F (36.2 C) (Temporal)    Resp 16    Ht 5\' 11"  (1.803 m)    Wt 209 lb 12.8 oz (95.2 kg)    BMI 29.26 kg/m  BP Readings from Last 3 Encounters:  05/06/20 (!) 127/92  02/16/20 (!) 124/98  11/05/19 132/84   Wt Readings from Last 3 Encounters:  05/06/20 209 lb 12.8 oz (95.2 kg)  02/16/20 216 lb 14.9 oz (98.4 kg)  11/05/19 217 lb 9.6 oz (98.7 kg)      Physical Exam Vitals reviewed.  Constitutional:      General: He is not in acute distress.    Appearance: Normal appearance. He is  well-developed, well-groomed and overweight. He is not ill-appearing or diaphoretic.  HENT:     Head: Normocephalic and atraumatic.  Cardiovascular:     Rate and Rhythm: Normal rate and regular rhythm.     Heart sounds: Normal heart sounds. No murmur heard.  No friction rub. No gallop.   Pulmonary:     Effort: Pulmonary effort is normal. No respiratory distress.     Breath sounds: Normal breath sounds. No wheezing or rales.  Musculoskeletal:     Cervical back: Normal range of motion and neck supple.  Right lower leg: No edema.     Left lower leg: No edema.  Neurological:     Mental Status: He is alert.  Psychiatric:        Mood and Affect: Mood normal.        Behavior: Behavior normal. Behavior is cooperative.        Thought Content: Thought content normal.        Judgment: Judgment normal.       Results for orders placed or performed in visit on 05/06/20  POCT glycosylated hemoglobin (Hb A1C)  Result Value Ref Range   Hemoglobin A1C 8.1 (A) 4.0 - 5.6 %   Est. average glucose Bld gHb Est-mCnc 186     Assessment & Plan     1. Type 2 diabetes mellitus with microalbuminuria, without long-term current use of insulin (HCC) A1c increased from 7.3 to 8.1. Will continue Metformin 500 BID and added Farxiga 10mg . Return in 3-6 months for f/u. - atorvastatin (LIPITOR) 20 MG tablet; Take 1 tablet (20 mg total) by mouth daily.  Dispense: 90 tablet; Refill: 3 - metFORMIN (GLUCOPHAGE) 500 MG tablet; Take 1 tablet (500 mg total) by mouth 2 (two) times daily with a meal.  Dispense: 180 tablet; Refill: 3 - dapagliflozin propanediol (FARXIGA) 10 MG TABS tablet; Take 1 tablet (10 mg total) by mouth daily before breakfast.  Dispense: 30 tablet; Refill: 0  2. Hypercholesterolemia with hypertriglyceridemia Stable. Diagnosis pulled for medication refill. Continue current medical treatment plan. - atorvastatin (LIPITOR) 20 MG tablet; Take 1 tablet (20 mg total) by mouth daily.  Dispense: 90  tablet; Refill: 3  3. Need for hepatitis C screening test Will check at next full blood draw.   Return in about 6 months (around 11/05/2020).      11/07/2020, PA-C, have reviewed all documentation for this visit. The documentation on 05/09/20 for the exam, diagnosis, procedures, and orders are all accurate and complete.   05/11/20  [facility] [phone] (phone) [phone] (fax)  [facility]

## 2020-05-06 ENCOUNTER — Other Ambulatory Visit: Payer: Self-pay

## 2020-05-06 ENCOUNTER — Encounter: Payer: Self-pay | Admitting: Physician Assistant

## 2020-05-06 ENCOUNTER — Ambulatory Visit (INDEPENDENT_AMBULATORY_CARE_PROVIDER_SITE_OTHER): Payer: Managed Care, Other (non HMO) | Admitting: Physician Assistant

## 2020-05-06 VITALS — BP 127/92 | HR 86 | Temp 97.1°F | Resp 16 | Ht 71.0 in | Wt 209.8 lb

## 2020-05-06 DIAGNOSIS — E782 Mixed hyperlipidemia: Secondary | ICD-10-CM | POA: Diagnosis not present

## 2020-05-06 DIAGNOSIS — Z1159 Encounter for screening for other viral diseases: Secondary | ICD-10-CM | POA: Diagnosis not present

## 2020-05-06 DIAGNOSIS — E1129 Type 2 diabetes mellitus with other diabetic kidney complication: Secondary | ICD-10-CM | POA: Diagnosis not present

## 2020-05-06 DIAGNOSIS — R809 Proteinuria, unspecified: Secondary | ICD-10-CM

## 2020-05-06 LAB — POCT GLYCOSYLATED HEMOGLOBIN (HGB A1C)
Est. average glucose Bld gHb Est-mCnc: 186
Hemoglobin A1C: 8.1 % — AB (ref 4.0–5.6)

## 2020-05-06 MED ORDER — METFORMIN HCL 500 MG PO TABS: 500.0000 mg | ORAL_TABLET | Freq: Two times a day (BID) | ORAL | 3 refills | Status: DC

## 2020-05-06 MED ORDER — DAPAGLIFLOZIN PROPANEDIOL 10 MG PO TABS: 10.0000 mg | ORAL_TABLET | Freq: Every day | ORAL | 0 refills | Status: DC

## 2020-05-06 MED ORDER — ATORVASTATIN CALCIUM 20 MG PO TABS: 20.0000 mg | ORAL_TABLET | Freq: Every day | ORAL | 3 refills | Status: DC

## 2020-05-06 NOTE — Patient Instructions (Signed)
Dapagliflozin tablets What is this medicine? DAPAGLIFLOZIN (DAP a gli FLOE zin) controls blood sugar in people with diabetes. It is used with lifestyle changes like diet and exercise. It also treats heart failure. It may lower the need for treatment of heart failure in the hospital. This medicine may be used for other purposes; ask your health care provider or pharmacist if you have questions. COMMON BRAND NAME(S): Farxiga What should I tell my health care provider before I take this medicine? They need to know if you have any of these conditions:  dehydration  diabetic ketoacidosis  diet low in salt  eating less due to illness, surgery, dieting, or any other reason  having surgery  history of pancreatitis or pancreas problems  history of yeast infection of the penis or vagina  if you often drink alcohol  infections in the bladder, kidneys, or urinary tract  kidney disease  low blood pressure  on hemodialysis  problems urinating  type 1 diabetes  uncircumcised male  an unusual or allergic reaction to dapagliflozin, other medicines, foods, dyes, or preservatives  pregnant or trying to get pregnant  breast-feeding How should I use this medicine? Take this medicine by mouth with a glass of water. Follow the directions on the prescription label. You can take it with or without food. If it upsets your stomach, take it with food. Take this medicine in the morning. Take your dose at the same time each day. Do not take more often than directed. Do not stop taking except on your doctor's advice. A special MedGuide will be given to you by the pharmacist with each prescription and refill. Be sure to read this information carefully each time. Talk to your pediatrician regarding the use of this medicine in children. Special care may be needed. Overdosage: If you think you have taken too much of this medicine contact a poison control center or emergency room at once. NOTE: This  medicine is only for you. Do not share this medicine with others. What if I miss a dose? If you miss a dose, take it as soon as you can. If it is almost time for your next dose, take only that dose. Do not take double or extra doses. What may interact with this medicine? Do not take this medicine with any of the following medications:  gatifloxacin This medicine may also interact with the following medications:  alcohol  certain medicines for blood pressure, heart disease  diuretics  insulin  nateglinide  pioglitazone  quinolone antibiotics like ciprofloxacin, levofloxacin, ofloxacin  repaglinide  some herbal dietary supplements  steroid medicines like prednisone or cortisone  sulfonylureas like glimepiride, glipizide, glyburide  thyroid medicine This list may not describe all possible interactions. Give your health care provider a list of all the medicines, herbs, non-prescription drugs, or dietary supplements you use. Also tell them if you smoke, drink alcohol, or use illegal drugs. Some items may interact with your medicine. What should I watch for while using this medicine? Visit your doctor or health care professional for regular checks on your progress. This medicine can cause a serious condition in which there is too much acid in the blood. If you develop nausea, vomiting, stomach pain, unusual tiredness, or breathing problems, stop taking this medicine and call your doctor right away. If possible, use a ketone dipstick to check for ketones in your urine. A test called the HbA1C (A1C) will be monitored. This is a simple blood test. It measures your blood sugar control over   the last 2 to 3 months. You will receive this test every 3 to 6 months. Learn how to check your blood sugar. Learn the symptoms of low and high blood sugar and how to manage them. Always carry a quick-source of sugar with you in case you have symptoms of low blood sugar. Examples include hard sugar  candy or glucose tablets. Make sure others know that you can choke if you eat or drink when you develop serious symptoms of low blood sugar, such as seizures or unconsciousness. They must get medical help at once. Tell your doctor or health care professional if you have high blood sugar. You might need to change the dose of your medicine. If you are sick or exercising more than usual, you might need to change the dose of your medicine. Do not skip meals. Ask your doctor or health care professional if you should avoid alcohol. Many nonprescription cough and cold products contain sugar or alcohol. These can affect blood sugar. Wear a medical ID bracelet or chain, and carry a card that describes your disease and details of your medicine and dosage times. What side effects may I notice from receiving this medicine? Side effects that you should report to your doctor or health care professional as soon as possible:  allergic reactions like skin rash, itching or hives, swelling of the face, lips, or tongue  breathing problems  dizziness  feeling faint or lightheaded, falls  muscle weakness  nausea, vomiting, unusual stomach upset or pain  new pain or tenderness, change in skin color, sores or ulcers, or infection in legs or feet  penile discharge, itching, or pain in men  signs and symptoms of a genital infection, such as fever; tenderness, redness, or swelling in the genitals or area from the genitals to the back of the rectum  signs and symptoms of low blood sugar such as feeling anxious, confusion, dizziness, increased hunger, unusually weak or tired, sweating, shakiness, cold, irritable, headache, blurred vision, fast heartbeat, loss of consciousness  signs and symptoms of a urinary tract infection, such as fever, chills, a burning feeling when urinating, blood in the urine, back pain  trouble passing urine or change in the amount of urine, including an urgent need to urinate more often, in  larger amounts, or at night  unusual tiredness  vaginal discharge, itching, or odor in women Side effects that usually do not require medical attention (report to your doctor or health care professional if they continue or are bothersome):  mild increase in urination  thirsty This list may not describe all possible side effects. Call your doctor for medical advice about side effects. You may report side effects to FDA at 1-800-FDA-1088. Where should I keep my medicine? Keep out of the reach of children. Store at room temperature between 15 and 30 degrees C (59 and 86 degrees F). Throw away any unused medicine after the expiration date. NOTE: This sheet is a summary. It may not cover all possible information. If you have questions about this medicine, talk to your doctor, pharmacist, or health care provider.  2020 Elsevier/Gold Standard (2019-03-27 18:58:14)  

## 2020-07-07 ENCOUNTER — Other Ambulatory Visit: Payer: Self-pay | Admitting: Physician Assistant

## 2020-07-07 DIAGNOSIS — E1129 Type 2 diabetes mellitus with other diabetic kidney complication: Secondary | ICD-10-CM

## 2020-07-07 DIAGNOSIS — L299 Pruritus, unspecified: Secondary | ICD-10-CM

## 2020-07-07 DIAGNOSIS — R809 Proteinuria, unspecified: Secondary | ICD-10-CM

## 2020-07-07 NOTE — Telephone Encounter (Signed)
Marland Kitchen Requested Prescriptions  Pending Prescriptions Disp Refills  . FARXIGA 10 MG TABS tablet [Pharmacy Med Name: FARXIGA 10 MG TABLET] 90 tablet 1    Sig: TAKE 1 TABLET (10 MG TOTAL) BY MOUTH DAILY BEFORE BREAKFAST.     Endocrinology:  Diabetes - SGLT2 Inhibitors Failed - 07/07/2020  7:16 PM      Failed - LDL in normal range and within 360 days    LDL Chol Calc (NIH)  Date Value Ref Range Status  11/05/2019 Comment (A) 0 - 99 mg/dL Final    Comment:    Triglyceride result indicated is too high for an accurate LDL cholesterol estimation.    Direct LDL  Date Value Ref Range Status  11/12/2015 81.0 mg/dL Final    Comment:    Optimal:  <100 mg/dLNear or Above Optimal:  100-129 mg/dLBorderline High:  130-159 mg/dLHigh:  160-189 mg/dLVery High:  >190 mg/dL         Failed - HBA1C is between 0 and 7.9 and within 180 days    Hemoglobin A1C  Date Value Ref Range Status  05/06/2020 8.1 (A) 4.0 - 5.6 % Final   Hgb A1c MFr Bld  Date Value Ref Range Status  11/05/2019 7.3 (H) 4.8 - 5.6 % Final    Comment:             Prediabetes: 5.7 - 6.4          Diabetes: >6.4          Glycemic control for adults with diabetes: <7.0          Passed - Cr in normal range and within 360 days    Creatinine, Ser  Date Value Ref Range Status  11/05/2019 1.01 0.76 - 1.27 mg/dL Final   Creatinine,U  Date Value Ref Range Status  01/23/2017 233.5 mg/dL Final         Passed - eGFR in normal range and within 360 days    GFR calc Af Amer  Date Value Ref Range Status  11/05/2019 103 >59 mL/min/1.73 Final   GFR calc non Af Amer  Date Value Ref Range Status  11/05/2019 89 >59 mL/min/1.73 Final   GFR  Date Value Ref Range Status  11/12/2015 87.65 >60.00 mL/min Final         Passed - Valid encounter within last 6 months    Recent Outpatient Visits          2 months ago Type 2 diabetes mellitus with microalbuminuria, without long-term current use of insulin [facility])   [place], Clearnce Sorrel, [place]   8 months ago Annual physical exam   [place], Clearnce Sorrel, [place]   1 year ago Primary osteoarthritis of right knee   [place], [place], [place]   1 year ago Primary osteoarthritis of left knee   [place], [place], [place]   2 years ago Primary osteoarthritis of left knee   [place], Clearnce Sorrel, [place]      Future Appointments            In 4 months [place], Clearnce Sorrel, PA-C [employer], [place]           . ammonium lactate (AMLACTIN) 12 % cream [Pharmacy Med Name: AMMONIUM LACTATE 12% CREAM] 385 g 1    Sig: APPLY TOPICALLY AS NEEDED FOR DRY SKIN.     Dermatology:  Other Passed - 07/07/2020  7:16 PM  Passed - Valid encounter within last 12 months    Recent Outpatient Visits          2 months ago Type 2 diabetes mellitus with microalbuminuria, without long-term current use of insulin [facility])   [facility] [place], Clearnce Sorrel, [place]   8 months ago Annual physical exam   [place], Clearnce Sorrel, [place]   1 year ago Primary osteoarthritis of right knee   [place], Clearnce Sorrel, [place]   1 year ago Primary osteoarthritis of left knee   [place], Clearnce Sorrel, [place]   2 years ago Primary osteoarthritis of left knee   [place], Clearnce Sorrel, [place]      Future Appointments            In 4 months Burnette, Clearnce Sorrel, PA-C [employer], [place]

## 2020-08-16 ENCOUNTER — Encounter: Payer: Self-pay | Admitting: Physician Assistant

## 2020-08-16 NOTE — Telephone Encounter (Signed)
The [facility] is not supposed to be scheduling on our covid clinic anyway, it is up to [place] to schedule.   Can we please call him back and schedule for covid 19 booster; ok by me. This week is full, but may be able to go on 08/26/20 schedule.   He can also get from his pharmacy if 10/7 is too far out.

## 2020-09-02 ENCOUNTER — Other Ambulatory Visit: Payer: Self-pay

## 2020-09-02 ENCOUNTER — Ambulatory Visit (INDEPENDENT_AMBULATORY_CARE_PROVIDER_SITE_OTHER): Payer: Managed Care, Other (non HMO)

## 2020-09-02 DIAGNOSIS — Z23 Encounter for immunization: Secondary | ICD-10-CM

## 2020-11-03 NOTE — Progress Notes (Deleted)
Complete physical exam   Patient: Bradley Carroll   DOB: [birth year]   49 y.o. Male  MRN: [id number] Visit Date: 11/05/2020  Today's healthcare provider: [name], PA-C   No chief complaint on file.  Subjective    Bradley Carroll is a 49 y.o. male who presents today for a complete physical exam.  He reports consuming a {diet types:17450} diet. {Exercise:19826} He generally feels {well/fairly well/poorly:18703}. He reports sleeping {well/fairly well/poorly:18703}. He {does/does not:200015} have additional problems to discuss today.  HPI  ***  Past Medical History:  Diagnosis Date  . Allergy   . Asthma   . Osteoarthritis    knees  . Pre-diabetes    Past Surgical History:  Procedure Laterality Date  . COLONOSCOPY WITH PROPOFOL N/A 02/16/2020   Procedure: COLONOSCOPY WITH PROPOFOL;  Surgeon: [name], MD;  Location: [place];  Service: Endoscopy;  Laterality: N/A;  . KNEE ARTHROSCOPY W/ PARTIAL MEDIAL MENISCECTOMY  2005   Social History   Socioeconomic History  . Marital status: Single    Spouse name: Not on file  . Number of children: Not on file  . Years of education: Not on file  . Highest education level: Not on file  Occupational History  . Not on file  Tobacco Use  . Smoking status: Current Some Day Smoker    Packs/day: 0.10    Years: 4.00    Pack years: 0.40  . Smokeless tobacco: Former Network engineer  . Vaping Use: Never used  Substance and Sexual Activity  . Alcohol use: Yes    Alcohol/week: 10.0 standard drinks    Types: 10 Cans of beer per week  . Drug use: No  . Sexual activity: Not on file  Other Topics Concern  . Not on file  Social History Narrative   Lives in [place] with wife and 2 kids.  Works as Art gallery manager.  Likes golf, basketball   Social Determinants of Radio broadcast assistant Strain: Not on file  Food Insecurity: Not on file  Transportation Needs: Not on file  Physical Activity: Not on file   Stress: Not on file  Social Connections: Not on file  Intimate Partner Violence: Not on file   Family Status  Relation Name Status  . Father  (Not Specified)       CHF  . Mother  (Not Specified)   Family History  Problem Relation Age of Onset  . Diabetes Father   . Hypertension Father   . Diabetes Mother    Allergies  Allergen Reactions  . Other Other (See Comments)    Shellfish-upset stomach.  Marland Kitchen Shellfish Allergy Nausea And Vomiting    Patient Care Team: [name], PA-C as PCP - General (Family Medicine)   Medications: Outpatient Medications Prior to Visit  Medication Sig  . ammonium lactate (AMLACTIN) 12 % cream APPLY TOPICALLY AS NEEDED FOR DRY SKIN.  Marland Kitchen atorvastatin (LIPITOR) 20 MG tablet Take 1 tablet (20 mg total) by mouth daily.  Marland Kitchen FARXIGA 10 MG TABS tablet TAKE 1 TABLET (10 MG TOTAL) BY MOUTH DAILY BEFORE BREAKFAST.  . metFORMIN (GLUCOPHAGE) 500 MG tablet Take 1 tablet (500 mg total) by mouth 2 (two) times daily with a meal.   No facility-administered medications prior to visit.    Review of Systems  {Heme  Chem  Endocrine  Serology  Results Review (optional):23779::" "}  Objective    There were no vitals taken for this visit. {Show previous  vital signs (optional):23777::" "}  Physical Exam  ***  Last depression screening scores PHQ 2/9 Scores 11/05/2019 09/26/2017  PHQ - 2 Score 0 0   Last fall risk screening Fall Risk  11/05/2019  Falls in the past year? 0  Number falls in past yr: 0  Injury with Fall? 0  Follow up Falls evaluation completed   Last Audit-C alcohol use screening Alcohol Use Disorder Test (AUDIT) 11/05/2019  1. How often do you have a drink containing alcohol? 2  2. How many drinks containing alcohol do you have on a typical day when you are drinking? 0  3. How often do you have six or more drinks on one occasion? 1  AUDIT-C Score 3  Alcohol Brief Interventions/Follow-up AUDIT Score <7 follow-up not indicated    A score of 3 or more in women, and 4 or more in men indicates increased risk for alcohol abuse, EXCEPT if all of the points are from question 1   No results found for any visits on 11/05/20.  Assessment & Plan    Routine Health Maintenance and Physical Exam  Exercise Activities and Dietary recommendations Goals   None     Immunization History  Administered Date(s) Administered  . Influenza, Seasonal, Injecte, Preservative Fre 08/20/2016  . Influenza,inj,Quad PF,6+ Mos 07/31/2013, 09/26/2017  . Influenza-Unspecified 11/26/2018  . MMR 03/02/1993  . PFIZER SARS-COV-2 Vaccination 12/25/2019, 01/15/2020, 09/02/2020  . Pneumococcal Polysaccharide-23 11/05/2019  . Tdap 11/27/2012    Health Maintenance  Topic Date Due  . Hepatitis C Screening  Never done  . OPHTHALMOLOGY EXAM  12/26/2014  . INFLUENZA VACCINE  06/20/2020  . URINE MICROALBUMIN  11/04/2020  . FOOT EXAM  11/04/2020  . HEMOGLOBIN A1C  11/05/2020  . TETANUS/TDAP  11/27/2022  . PNEUMOCOCCAL POLYSACCHARIDE VACCINE AGE 54-64 HIGH RISK  Completed  . COVID-19 Vaccine  Completed  . HIV Screening  Completed    Discussed health benefits of physical activity, and encouraged him to engage in regular exercise appropriate for his age and condition.  ***  No follow-ups on file.     {provider attestation***:1}   Rubye Beach  [facility] [phone] (phone) [phone] (fax)  [place]

## 2020-11-05 ENCOUNTER — Encounter: Payer: Self-pay | Admitting: Physician Assistant

## 2020-11-29 ENCOUNTER — Encounter: Payer: Self-pay | Admitting: Physician Assistant

## 2021-01-03 ENCOUNTER — Ambulatory Visit (INDEPENDENT_AMBULATORY_CARE_PROVIDER_SITE_OTHER): Payer: Managed Care, Other (non HMO) | Admitting: Physician Assistant

## 2021-01-03 ENCOUNTER — Other Ambulatory Visit: Payer: Self-pay

## 2021-01-03 ENCOUNTER — Encounter: Payer: Self-pay | Admitting: Physician Assistant

## 2021-01-03 VITALS — BP 146/113 | HR 91 | Temp 98.4°F | Ht 71.0 in | Wt 204.0 lb

## 2021-01-03 DIAGNOSIS — E781 Pure hyperglyceridemia: Secondary | ICD-10-CM | POA: Diagnosis not present

## 2021-01-03 DIAGNOSIS — Z125 Encounter for screening for malignant neoplasm of prostate: Secondary | ICD-10-CM

## 2021-01-03 DIAGNOSIS — Z0000 Encounter for general adult medical examination without abnormal findings: Secondary | ICD-10-CM | POA: Diagnosis not present

## 2021-01-03 DIAGNOSIS — E1129 Type 2 diabetes mellitus with other diabetic kidney complication: Secondary | ICD-10-CM

## 2021-01-03 DIAGNOSIS — Z6828 Body mass index (BMI) 28.0-28.9, adult: Secondary | ICD-10-CM

## 2021-01-03 DIAGNOSIS — R809 Proteinuria, unspecified: Secondary | ICD-10-CM

## 2021-01-03 DIAGNOSIS — Z1159 Encounter for screening for other viral diseases: Secondary | ICD-10-CM

## 2021-01-03 NOTE — Progress Notes (Signed)
Complete physical exam   Patient: Bradley Carroll   DOB: [birth year]   50 y.o. Male  MRN: [id number] Visit Date: 01/03/2021  Today's healthcare provider: [name], PA-C   Chief Complaint  Patient presents with  . Annual Exam   Subjective    Bradley Carroll is a 50 y.o. male who presents today for a complete physical exam.  He reports consuming a general diet. Exercises regularly He generally feels well. He reports sleeping well. He does have additional problems to discuss today.  HPI    Past Medical History:  Diagnosis Date  . Allergy   . Asthma   . Osteoarthritis    knees  . Pre-diabetes    Past Surgical History:  Procedure Laterality Date  . COLONOSCOPY WITH PROPOFOL N/A 02/16/2020   Procedure: COLONOSCOPY WITH PROPOFOL;  Surgeon: [name], MD;  Location: [place];  Service: Endoscopy;  Laterality: N/A;  . KNEE ARTHROSCOPY W/ PARTIAL MEDIAL MENISCECTOMY  2005   Social History   Socioeconomic History  . Marital status: Single    Spouse name: Not on file  . Number of children: Not on file  . Years of education: Not on file  . Highest education level: Not on file  Occupational History  . Not on file  Tobacco Use  . Smoking status: Former Smoker    Packs/day: 0.10    Years: 4.00    Pack years: 0.40  . Smokeless tobacco: Former Network engineer  . Vaping Use: Never used  Substance and Sexual Activity  . Alcohol use: Yes    Alcohol/week: 10.0 standard drinks    Types: 10 Cans of beer per week  . Drug use: No  . Sexual activity: Not on file  Other Topics Concern  . Not on file  Social History Narrative   Lives in [place] with wife and 2 kids.  Works as Art gallery manager.  Likes golf, basketball   Social Determinants of Radio broadcast assistant Strain: Not on file  Food Insecurity: Not on file  Transportation Needs: Not on file  Physical Activity: Not on file  Stress: Not on file  Social Connections: Not on file  Intimate  Partner Violence: Not on file   Family Status  Relation Name Status  . Father  Deceased       CHF  . Mother  (Not Specified)  . Sister  Alive  . Sister  Alive  . Sister  Alive  . Sister  Alive  . Neg Hx  (Not Specified)   Family History  Problem Relation Age of Onset  . Diabetes Father   . Hypertension Father   . Congestive Heart Failure Father   . Diabetes Mother   . Diabetes Sister   . Diabetes Sister   . Colon cancer Neg Hx   . Prostate cancer Neg Hx    Allergies  Allergen Reactions  . Other Other (See Comments)    Shellfish-upset stomach.  Marland Kitchen Shellfish Allergy Nausea And Vomiting    Patient Care Team: [name], PA-C as PCP - General (Family Medicine)   Medications: Outpatient Medications Prior to Visit  Medication Sig  . ammonium lactate (AMLACTIN) 12 % cream APPLY TOPICALLY AS NEEDED FOR DRY SKIN.  Marland Kitchen atorvastatin (LIPITOR) 20 MG tablet Take 1 tablet (20 mg total) by mouth daily.  Marland Kitchen FARXIGA 10 MG TABS tablet TAKE 1 TABLET (10 MG TOTAL) BY MOUTH DAILY BEFORE BREAKFAST.  . meloxicam (MOBIC) 15 MG  tablet Take 15 mg by mouth daily.  . metFORMIN (GLUCOPHAGE) 500 MG tablet Take 1 tablet (500 mg total) by mouth 2 (two) times daily with a meal.   No facility-administered medications prior to visit.    Review of Systems  Constitutional: Negative.   HENT: Negative.   Eyes: Negative.   Respiratory: Negative.   Cardiovascular: Negative.   Gastrointestinal: Negative.   Endocrine: Negative.   Genitourinary: Negative.   Musculoskeletal: Negative.   Skin: Negative.   Allergic/Immunologic: Negative.   Neurological: Negative.   Hematological: Negative.   Psychiatric/Behavioral: Negative.       Objective    BP (!) 148/114 (BP Location: Right Arm, Patient Position: Sitting, Cuff Size: Large)   Pulse 91   Temp 98.4 F (36.9 C) (Oral)   Ht _0  (1.803 m)   Wt 204 lb (92.5 kg)   BMI 28.45 kg/m    Physical Exam Vitals reviewed.  Constitutional:       General: He is not in acute distress.    Appearance: Normal appearance. He is well-developed, well-groomed and overweight. He is not ill-appearing.  HENT:     Head: Normocephalic and atraumatic.     Right Ear: Tympanic membrane, ear canal and external ear normal.     Left Ear: Tympanic membrane, ear canal and external ear normal.     Nose: Nose normal.     Mouth/Throat:     Mouth: Mucous membranes are moist.     Pharynx: Oropharynx is clear. No oropharyngeal exudate or posterior oropharyngeal erythema.  Eyes:     General:        Right eye: No discharge.     Extraocular Movements: Extraocular movements intact.     Conjunctiva/sclera: Conjunctivae normal.     Pupils: Pupils are equal, round, and reactive to light.  Neck:     Thyroid: No thyromegaly.     Vascular: No carotid bruit.     Trachea: No tracheal deviation.  Cardiovascular:     Rate and Rhythm: Normal rate and regular rhythm.     Pulses: Normal pulses.     Heart sounds: Normal heart sounds. No murmur heard.   Pulmonary:     Effort: Pulmonary effort is normal. No respiratory distress.     Breath sounds: Normal breath sounds. No wheezing or rales.  Chest:     Chest wall: No tenderness.  Abdominal:     General: Abdomen is flat. Bowel sounds are normal. There is no distension.     Palpations: Abdomen is soft. There is no mass.     Tenderness: There is no abdominal tenderness. There is no guarding or rebound.  Musculoskeletal:        General: No tenderness. Normal range of motion.     Cervical back: Normal range of motion and neck supple. No tenderness.     Right lower leg: No edema.     Left lower leg: No edema.  Lymphadenopathy:     Cervical: No cervical adenopathy.  Skin:    General: Skin is warm and dry.     Capillary Refill: Capillary refill takes less than 2 seconds.     Findings: No erythema or rash.  Neurological:     General: No focal deficit present.     Mental Status: He is alert and oriented to  person, place, and time. Mental status is at baseline.     Cranial Nerves: No cranial nerve deficit.     Motor: No abnormal muscle tone.  Coordination: Coordination normal.     Deep Tendon Reflexes: Reflexes are normal and symmetric. Reflexes normal.  Psychiatric:        Mood and Affect: Mood normal.        Behavior: Behavior normal. Behavior is cooperative.        Thought Content: Thought content normal.        Judgment: Judgment normal.      Last depression screening scores PHQ 2/9 Scores 01/03/2021 01/03/2021 11/05/2019  PHQ - 2 Score 0 0 0  PHQ- 9 Score 0 - -   Last fall risk screening Fall Risk  01/03/2021  Falls in the past year? 0  Number falls in past yr: 0  Injury with Fall? 0  Risk for fall due to : No Fall Risks  Follow up Falls evaluation completed   Last Audit-C alcohol use screening Alcohol Use Disorder Test (AUDIT) 01/03/2021  1. How often do you have a drink containing alcohol? 2  2. How many drinks containing alcohol do you have on a typical day when you are drinking? 1  3. How often do you have six or more drinks on one occasion? 1  AUDIT-C Score 4  Alcohol Brief Interventions/Follow-up -   A score of 3 or more in women, and 4 or more in men indicates increased risk for alcohol abuse, EXCEPT if all of the points are from question 1   No results found for any visits on 01/03/21.  Assessment & Plan    Routine Health Maintenance and Physical Exam  Exercise Activities and Dietary recommendations Goals   None     Immunization History  Administered Date(s) Administered  . Influenza, Seasonal, Injecte, Preservative Fre 08/20/2016  . Influenza,inj,Quad PF,6+ Mos 07/31/2013, 09/26/2017  . Influenza-Unspecified 11/26/2018, 09/03/2020  . MMR 03/02/1993  . PFIZER(Purple Top)SARS-COV-2 Vaccination 12/25/2019, 01/15/2020, 09/02/2020  . Pneumococcal Polysaccharide-23 11/05/2019  . Tdap 11/27/2012    Health Maintenance  Topic Date Due  . Hepatitis C  Screening  Never done  . OPHTHALMOLOGY EXAM  12/26/2014  . FOOT EXAM  11/04/2020  . URINE MICROALBUMIN  11/04/2020  . HEMOGLOBIN A1C  11/05/2020  . TETANUS/TDAP  11/27/2022  . COLONOSCOPY (Pts 45-61yr Insurance coverage will need to be confirmed)  02/15/2030  . INFLUENZA VACCINE  Completed  . PNEUMOCOCCAL POLYSACCHARIDE VACCINE AGE 52-64 HIGH RISK  Completed  . COVID-19 Vaccine  Completed  . HIV Screening  Completed    Discussed health benefits of physical activity, and encouraged him to engage in regular exercise appropriate for his age and condition.  1. Annual physical exam Normal physical exam today. Will check labs as below and f/u pending lab results. If labs are stable and WNL she will not need to have these rechecked for one year at her next annual physical exam. She is to call the office in the meantime if she has any acute issue, questions or concerns. - CBC w/Diff/Platelet - Comprehensive Metabolic Panel (CMET) - TSH - Lipid Panel With LDL/HDL Ratio - HgB A1c  2. HYPERTRIGLYCERIDEMIA Stable on atorvastatin 247m Will check labs as below and f/u pending results. - CBC w/Diff/Platelet - Comprehensive Metabolic Panel (CMET) - TSH - Lipid Panel With LDL/HDL Ratio - HgB A1c  3. Type 2 diabetes mellitus with microalbuminuria, without long-term current use of insulin (HCC) Stable on Farxiga 1057mWill check labs as below and f/u pending results. Referral to eye doctor placed as below.  - Ambulatory referral to Ophthalmology - CBC w/Diff/Platelet - Comprehensive Metabolic Panel (  CMET) - TSH - Lipid Panel With LDL/HDL Ratio - HgB A1c  4. BMI 28.0-28.9,adult Counseled patient on healthy lifestyle modifications including dieting and exercise.  - CBC w/Diff/Platelet - Comprehensive Metabolic Panel (CMET) - TSH - Lipid Panel With LDL/HDL Ratio - HgB A1c  5. Prostate cancer screening Will check labs as below and f/u pending results. - PSA  6. Encounter for hepatitis C  screening test for low risk patient Will check labs as below and f/u pending results. - Hepatitis C Antibody   Return in about 1 year (around 01/03/2022) for CPE.     Reynolds Bowl, PA-C, have reviewed all documentation for this visit. The documentation on 01/03/21 for the exam, diagnosis, procedures, and orders are all accurate and complete.   Rubye Beach  [facility] [phone] (phone) [phone] (fax)  [place]

## 2021-01-03 NOTE — Patient Instructions (Signed)

## 2021-01-04 LAB — LIPID PANEL WITH LDL/HDL RATIO
Cholesterol, Total: 233 mg/dL — ABNORMAL HIGH (ref 100–199)
HDL: 60 mg/dL (ref >39)
LDL Chol Calc (NIH): 119 mg/dL — ABNORMAL HIGH (ref 0–99)
LDL/HDL Ratio: 2 ratio (ref 0.0–3.6)
Triglycerides: 309 mg/dL — ABNORMAL HIGH (ref 0–149)
VLDL Cholesterol Cal: 54 mg/dL — ABNORMAL HIGH (ref 5–40)

## 2021-01-04 LAB — PSA: Prostate Specific Ag, Serum: 1.1 ng/mL (ref 0.0–4.0)

## 2021-01-04 LAB — CBC WITH DIFFERENTIAL/PLATELET
Basophils Absolute: 0 10*3/uL (ref 0.0–0.2)
Basos: 1 %
EOS (ABSOLUTE): 0.1 10*3/uL (ref 0.0–0.4)
Eos: 2 %
Hematocrit: 49.9 % (ref 37.5–51.0)
Hemoglobin: 16.8 g/dL (ref 13.0–17.7)
Immature Grans (Abs): 0 10*3/uL (ref 0.0–0.1)
Immature Granulocytes: 1 %
Lymphocytes Absolute: 1.2 10*3/uL (ref 0.7–3.1)
Lymphs: 31 %
MCH: 30.4 pg (ref 26.6–33.0)
MCHC: 33.7 g/dL (ref 31.5–35.7)
MCV: 90 fL (ref 79–97)
Monocytes Absolute: 0.3 10*3/uL (ref 0.1–0.9)
Monocytes: 8 %
Neutrophils Absolute: 2.2 10*3/uL (ref 1.4–7.0)
Neutrophils: 57 %
Platelets: 238 10*3/uL (ref 150–450)
RBC: 5.52 x10E6/uL (ref 4.14–5.80)
RDW: 13.1 % (ref 11.6–15.4)
WBC: 3.9 10*3/uL (ref 3.4–10.8)

## 2021-01-04 LAB — COMPREHENSIVE METABOLIC PANEL
ALT: 31 IU/L (ref 0–44)
AST: 39 IU/L (ref 0–40)
Albumin/Globulin Ratio: 1.8 (ref 1.2–2.2)
Albumin: 4.9 g/dL (ref 4.0–5.0)
Alkaline Phosphatase: 73 IU/L (ref 44–121)
BUN/Creatinine Ratio: 13 (ref 9–20)
BUN: 12 mg/dL (ref 6–24)
Bilirubin Total: 1 mg/dL (ref 0.0–1.2)
CO2: 23 mmol/L (ref 20–29)
Calcium: 10.1 mg/dL (ref 8.7–10.2)
Chloride: 99 mmol/L (ref 96–106)
Creatinine, Ser: 0.9 mg/dL (ref 0.76–1.27)
GFR calc Af Amer: 117 mL/min/{1.73_m2} (ref >59)
GFR calc non Af Amer: 101 mL/min/{1.73_m2} (ref >59)
Globulin, Total: 2.7 g/dL (ref 1.5–4.5)
Glucose: 186 mg/dL — ABNORMAL HIGH (ref 65–99)
Potassium: 4.8 mmol/L (ref 3.5–5.2)
Sodium: 137 mmol/L (ref 134–144)
Total Protein: 7.6 g/dL (ref 6.0–8.5)

## 2021-01-04 LAB — TSH: TSH: 0.307 u[IU]/mL — ABNORMAL LOW (ref 0.450–4.500)

## 2021-01-04 LAB — HEMOGLOBIN A1C
Est. average glucose Bld gHb Est-mCnc: 186 mg/dL
Hgb A1c MFr Bld: 8.1 % — ABNORMAL HIGH (ref 4.8–5.6)

## 2021-01-04 LAB — HEPATITIS C ANTIBODY: Hep C Virus Ab: <0.1 s/co ratio (ref 0.0–0.9)

## 2021-01-06 LAB — HM DIABETES EYE EXAM

## 2021-01-20 ENCOUNTER — Encounter: Payer: Self-pay | Admitting: Physician Assistant

## 2021-03-29 ENCOUNTER — Other Ambulatory Visit: Payer: Self-pay | Admitting: Physician Assistant

## 2021-03-29 DIAGNOSIS — L299 Pruritus, unspecified: Secondary | ICD-10-CM

## 2021-05-15 ENCOUNTER — Other Ambulatory Visit: Payer: Self-pay | Admitting: Physician Assistant

## 2021-05-15 DIAGNOSIS — E782 Mixed hyperlipidemia: Secondary | ICD-10-CM

## 2021-05-15 DIAGNOSIS — E1129 Type 2 diabetes mellitus with other diabetic kidney complication: Secondary | ICD-10-CM

## 2021-06-17 ENCOUNTER — Encounter: Payer: Self-pay | Admitting: Physician Assistant

## 2021-08-15 ENCOUNTER — Other Ambulatory Visit: Payer: Self-pay | Admitting: Family Medicine

## 2021-08-15 DIAGNOSIS — L299 Pruritus, unspecified: Secondary | ICD-10-CM

## 2021-08-19 ENCOUNTER — Encounter: Payer: Self-pay | Admitting: Physician Assistant

## 2021-11-10 ENCOUNTER — Other Ambulatory Visit: Payer: Self-pay | Admitting: Family Medicine

## 2021-11-10 DIAGNOSIS — L299 Pruritus, unspecified: Secondary | ICD-10-CM

## 2021-11-16 MED ORDER — AMMONIUM LACTATE 12 % EX CREA: TOPICAL_CREAM | CUTANEOUS | 0 refills | Status: DC | PRN

## 2021-11-16 NOTE — Telephone Encounter (Signed)
Pt scheduled the next available appt, needs his refill now. Please advise, pt is completely out. [facility].

## 2021-11-16 NOTE — Addendum Note (Signed)
Addended by: Wilford Corner on: 11/16/2021 11:17 AM   Modules accepted: Orders

## 2021-11-16 NOTE — Telephone Encounter (Signed)
Courtesy refill until appointment.  Requested Prescriptions  Pending Prescriptions Disp Refills   ammonium lactate (AMLACTIN) 12 % cream 280 g 0    Sig: Apply topically as needed for dry skin.     Dermatology:  Other Passed - 11/16/2021 11:17 AM      Passed - Valid encounter within last 12 months    Recent Outpatient Visits          10 months ago Annual physical exam   [facility] Joy[place], [place]   1 year ago Type 2 diabetes mellitus with microalbuminuria, without long-term current use of insulin [facility])   [facility] [place], Alessandra [place], [place]   2 years ago Annual physical exam   [facility] [place], Alessandra [place], [place]   2 years ago Primary osteoarthritis of right knee   [facility] [place], [place], [place]   3 years ago Primary osteoarthritis of left knee   [facility] [place], [place]andra [place], [place]      Future Appointments            In 1 week [name], PA-C [facility] [employer] [facility], [facility]           Refused Prescriptions Disp Refills   ammonium lactate (AMLACTIN) 12 % cream [Pharmacy Med Name: AMMONIUM LACTATE 12% CREAM] 280 g 2    Sig: APPLY TOPICALLY AS NEEDED FOR DRY SKIN.     Dermatology:  Other Passed - 11/16/2021 11:17 AM      Passed - Valid encounter within last 12 months    Recent Outpatient Visits          10 months ago Annual physical exam   [facility] Joy[place]elyn [place], [place]   1 year ago Type 2 diabetes mellitus with microalbuminuria, without long-term current use of insulin [facility])   [facility] [place], Alessandra [place], [place]   2 years ago Annual physical exam   [facility] [place], Alessandra [place], [place]   2 years ago Primary osteoarthritis of right knee   [facility] [place], [place], [place]   3 years ago Primary osteoarthritis of left knee   [facility] [place], Alessandra [place], [place]       Future Appointments            In 1 week [name], PA-C [facility], [facility]

## 2021-11-22 NOTE — Progress Notes (Signed)
Established patient visit   Patient: Bradley Carroll   DOB: [birth year]   51 y.o. Male  MRN: [id number] Visit Date: 11/23/2021  Today's healthcare provider: Mikey Kirschner, PA-C   Chief Complaint  Patient presents with   Diabetes   Hyperlipidemia   I,Sulibeya S Dimas,acting as a scribe for Mikey Kirschner, PA-C.,have documented all relevant documentation on the behalf of Mikey Kirschner, PA-C,as directed by  Mikey Kirschner, PA-C while in the presence of Mikey Kirschner, PA-C.  Subjective    HPI  Diabetes Mellitus Type II, Follow-up  Lab Results  Component Value Date   HGBA1C 8.4 (A) 11/23/2021   HGBA1C 8.1 (H) 01/03/2021   HGBA1C 8.1 (A) 05/06/2020   Wt Readings from Last 3 Encounters:  11/23/21 212 lb 11.2 oz (96.5 kg)  01/03/21 204 lb (92.5 kg)  05/06/20 209 lb 12.8 oz (95.2 kg)   Last seen for diabetes 11 months ago.  Management since then includes increase Metformin to 1000mg  twice daily, but pt states he was never told this information. Has been taking 500 twice daily.  He reports excellent compliance with treatment. He is not having side effects.  Symptoms: No fatigue No foot ulcerations  No appetite changes No nausea  No paresthesia of the feet  No polydipsia  No polyuria No visual disturbances   No vomiting     Home blood sugar records:  not being checked  Episodes of hypoglycemia? No , not checking, no symptoms.    Current insulin regiment: none Most Recent Eye Exam: UTD Current exercise: no regular exercise and yard work Current diet habits: in general, a "healthy" diet    Pertinent Labs: Lab Results  Component Value Date   CHOL 233 (H) 01/03/2021   HDL 60 01/03/2021   LDLCALC 119 (H) 01/03/2021   LDLDIRECT 81.0 11/12/2015   TRIG 309 (H) 01/03/2021   CHOLHDL 5.4 (H) 11/05/2019   Lab Results  Component Value Date   NA 137 01/03/2021   K 4.8 01/03/2021   CREATININE 0.90 01/03/2021   GFRNONAA 101 01/03/2021   MICROALBUR 20 11/05/2019      --------------------------------------------------------------------------------------------------- Lipid/Cholesterol, Follow-up  Last lipid panel Other pertinent labs  Lab Results  Component Value Date   CHOL 233 (H) 01/03/2021   HDL 60 01/03/2021   LDLCALC 119 (H) 01/03/2021   LDLDIRECT 81.0 11/12/2015   TRIG 309 (H) 01/03/2021   CHOLHDL 5.4 (H) 11/05/2019   Lab Results  Component Value Date   ALT 31 01/03/2021   AST 39 01/03/2021   PLT 238 01/03/2021   TSH 0.307 (L) 01/03/2021     He was last seen for this 11 months ago.  Management since that visit includes increase Atorvastatin from 20mg  to 40mg , but again he wasn't relayed this information and has been taking 20 mg daily.   He reports excellent compliance with treatment. He is not having side effects.   Symptoms: No chest pain No chest pressure/discomfort  No dyspnea No lower extremity edema  No numbness or tingling of extremity No orthopnea  No palpitations No paroxysmal nocturnal dyspnea  No speech difficulty No syncope   Current diet: in general, a "healthy" diet   Current exercise: no regular exercise and yard work  The 10-year ASCVD risk score (Arnett DK, et al., 2019) is: 10%  ---------------------------------------------------------------------------------------  Medications: Outpatient Medications Prior to Visit  Medication Sig   ammonium lactate (AMLACTIN) 12 % cream Apply topically as needed for dry skin.   meloxicam (MOBIC) 15  MG tablet Take 15 mg by mouth daily.   [DISCONTINUED] atorvastatin (LIPITOR) 20 MG tablet TAKE 1 TABLET BY MOUTH EVERY DAY   [DISCONTINUED] metFORMIN (GLUCOPHAGE) 500 MG tablet TAKE 1 TABLET (500 MG TOTAL) BY MOUTH 2 (TWO) TIMES DAILY WITH A MEAL. (MAIL ORDER REQ. OR [facility]   [DISCONTINUED] FARXIGA 10 MG TABS tablet TAKE 1 TABLET (10 MG TOTAL) BY MOUTH DAILY BEFORE BREAKFAST.   No facility-administered medications prior to visit.    Review of Systems  Constitutional:   Negative for appetite change and fatigue.  Respiratory:  Negative for chest tightness and shortness of breath.   Cardiovascular:  Negative for chest pain and leg swelling.       Objective    BP (!) 137/107 (BP Location: Right Arm, Patient Position: Sitting, Cuff Size: Large)    Pulse (!) 107    Temp 98 F (36.7 C) (Oral)    Resp 16    Wt 212 lb 11.2 oz (96.5 kg)    SpO2 100%    BMI 29.67 kg/m  BP Readings from Last 3 Encounters:  11/23/21 (!) 137/107  01/03/21 (!) 146/113  05/06/20 (!) 127/92   Wt Readings from Last 3 Encounters:  11/23/21 212 lb 11.2 oz (96.5 kg)  01/03/21 204 lb (92.5 kg)  05/06/20 209 lb 12.8 oz (95.2 kg)    Physical Exam Constitutional:      General: He is awake.     Appearance: He is well-developed.  HENT:     Head: Normocephalic.  Eyes:     Conjunctiva/sclera: Conjunctivae normal.  Neck:     Thyroid: No thyroid mass or thyromegaly.  Cardiovascular:     Rate and Rhythm: Normal rate and regular rhythm.     Heart sounds: Normal heart sounds.  Pulmonary:     Effort: Pulmonary effort is normal.     Breath sounds: Normal breath sounds.  Skin:    General: Skin is warm.  Neurological:     Mental Status: He is alert and oriented to person, place, and time.  Psychiatric:        Attention and Perception: Attention normal.        Mood and Affect: Mood normal.        Speech: Speech normal.        Behavior: Behavior is cooperative.      Results for orders placed or performed in visit on 11/23/21  POCT HgB A1C  Result Value Ref Range   Hemoglobin A1C 8.4 (A) 4.0 - 5.6 %   Est. average glucose Bld gHb Est-mCnc 194     Assessment & Plan     Problem List Items Addressed This Visit       Cardiovascular and Mediastinum   Hypertension associated with diabetes ([place])    Never diagnosed previously, but has consistently elevated BP. Pt does not check at home but does have a BP cuff. Advised we start Amlodipine 5 mg, for him to check occasionally at home.        Relevant Medications   metFORMIN (GLUCOPHAGE) 1000 MG tablet   atorvastatin (LIPITOR) 40 MG tablet   amLODipine (NORVASC) 5 MG tablet   Other Relevant Orders   Comprehensive Metabolic Panel (CMET)     Endocrine   Type 2 diabetes mellitus with microalbuminuria, without long-term current use of insulin ([place]) - Primary    We will now increase Metformin to 1000 mg BID .  Hemoglobin A1C Latest Ref Rng & Units 11/23/2021 01/03/2021  HGBA1C 4.0 -  5.6 % 8.4(A) 8.1(H)  Some recent data might be hidden   f/u 3 months       Relevant Medications   metFORMIN (GLUCOPHAGE) 1000 MG tablet   atorvastatin (LIPITOR) 40 MG tablet   Other Relevant Orders   POCT HgB A1C (Completed)     Musculoskeletal and Integument   Osteoarthritis of knee    Per pt insurance will not cover injections.  We can re-address in future. Advised consequences of long term mobic, but ok to continue for now.         Other   Hypercholesterolemia with hypertriglyceridemia    Recheck lipids, fasting as per pt preference. Based on values from 2/22, increase atorvastatin 20 to 40 mg.       Relevant Medications   atorvastatin (LIPITOR) 40 MG tablet   amLODipine (NORVASC) 5 MG tablet   Other Relevant Orders   Comprehensive Metabolic Panel (CMET)   Lipid Profile   Abnormal thyroid stimulating hormone level    Low TSH 2/22 Normal feeling thyroid today Will recheck w/ TSI to r/o graves, but low suspicion      Relevant Orders   TSH + free T4   Thyroid Stimulating Immunoglobulin    Return in about 3 months (around 02/21/2022) for DMII.      I, Mikey Kirschner, PA-C have reviewed all documentation for this visit. The documentation on 11/23/2021 for the exam, diagnosis, procedures, and orders are all accurate and complete.    Mikey Kirschner, PA-C  [facility] [phone] (phone) [phone] (fax)  [place]

## 2021-11-23 ENCOUNTER — Ambulatory Visit (INDEPENDENT_AMBULATORY_CARE_PROVIDER_SITE_OTHER): Payer: Managed Care, Other (non HMO) | Admitting: Physician Assistant

## 2021-11-23 ENCOUNTER — Other Ambulatory Visit: Payer: Self-pay

## 2021-11-23 ENCOUNTER — Encounter: Payer: Self-pay | Admitting: Physician Assistant

## 2021-11-23 VITALS — BP 137/107 | HR 107 | Temp 98.0°F | Resp 16 | Wt 212.7 lb

## 2021-11-23 DIAGNOSIS — E782 Mixed hyperlipidemia: Secondary | ICD-10-CM | POA: Diagnosis not present

## 2021-11-23 DIAGNOSIS — E1129 Type 2 diabetes mellitus with other diabetic kidney complication: Secondary | ICD-10-CM

## 2021-11-23 DIAGNOSIS — R7989 Other specified abnormal findings of blood chemistry: Secondary | ICD-10-CM

## 2021-11-23 DIAGNOSIS — E785 Hyperlipidemia, unspecified: Secondary | ICD-10-CM | POA: Insufficient documentation

## 2021-11-23 DIAGNOSIS — E1159 Type 2 diabetes mellitus with other circulatory complications: Secondary | ICD-10-CM | POA: Diagnosis not present

## 2021-11-23 DIAGNOSIS — I152 Hypertension secondary to endocrine disorders: Secondary | ICD-10-CM

## 2021-11-23 DIAGNOSIS — R809 Proteinuria, unspecified: Secondary | ICD-10-CM

## 2021-11-23 DIAGNOSIS — M1712 Unilateral primary osteoarthritis, left knee: Secondary | ICD-10-CM

## 2021-11-23 LAB — POCT GLYCOSYLATED HEMOGLOBIN (HGB A1C)
Est. average glucose Bld gHb Est-mCnc: 194
Hemoglobin A1C: 8.4 % — AB (ref 4.0–5.6)

## 2021-11-23 MED ORDER — ATORVASTATIN CALCIUM 40 MG PO TABS: 40.0000 mg | ORAL_TABLET | Freq: Every day | ORAL | 3 refills | Status: DC

## 2021-11-23 MED ORDER — AMLODIPINE BESYLATE 5 MG PO TABS: 5.0000 mg | ORAL_TABLET | Freq: Every day | ORAL | 1 refills | Status: DC

## 2021-11-23 MED ORDER — METFORMIN HCL 1000 MG PO TABS: 1000.0000 mg | ORAL_TABLET | Freq: Two times a day (BID) | ORAL | 3 refills | Status: DC

## 2021-11-23 NOTE — Assessment & Plan Note (Signed)
We will now increase Metformin to 1000 mg BID .  Hemoglobin A1C Latest Ref Rng & Units 11/23/2021 01/03/2021  HGBA1C 4.0 - 5.6 % 8.4(A) 8.1(H)  Some recent data might be hidden   f/u 3 months

## 2021-11-23 NOTE — Assessment & Plan Note (Signed)
Low TSH 2/22 Normal feeling thyroid today Will recheck w/ TSI to r/o graves, but low suspicion

## 2021-11-23 NOTE — Assessment & Plan Note (Signed)
Recheck lipids, fasting as per pt preference. Based on values from 2/22, increase atorvastatin 20 to 40 mg.

## 2021-11-23 NOTE — Patient Instructions (Signed)
Goal blood pressure: < 130/ 90  Too low for you: < 110 / < 65

## 2021-11-23 NOTE — Assessment & Plan Note (Signed)
Per pt insurance will not cover injections.  We can re-address in future. Advised consequences of long term mobic, but ok to continue for now.

## 2021-11-23 NOTE — Assessment & Plan Note (Signed)
Never diagnosed previously, but has consistently elevated BP. Pt does not check at home but does have a BP cuff. Advised we start Amlodipine 5 mg, for him to check occasionally at home.

## 2021-11-29 ENCOUNTER — Encounter: Payer: Self-pay | Admitting: Physician Assistant

## 2021-11-30 ENCOUNTER — Other Ambulatory Visit: Payer: Self-pay | Admitting: Physician Assistant

## 2021-11-30 DIAGNOSIS — M1712 Unilateral primary osteoarthritis, left knee: Secondary | ICD-10-CM

## 2021-11-30 MED ORDER — MELOXICAM 15 MG PO TABS: 15.0000 mg | ORAL_TABLET | Freq: Every day | ORAL | 1 refills | Status: AC

## 2021-12-01 LAB — LIPID PANEL
Chol/HDL Ratio: 1.9 ratio (ref 0.0–5.0)
Cholesterol, Total: 139 mg/dL (ref 100–199)
HDL: 72 mg/dL (ref >39)
LDL Chol Calc (NIH): 35 mg/dL (ref 0–99)
Triglycerides: 207 mg/dL — ABNORMAL HIGH (ref 0–149)
VLDL Cholesterol Cal: 32 mg/dL (ref 5–40)

## 2021-12-01 LAB — COMPREHENSIVE METABOLIC PANEL
ALT: 25 IU/L (ref 0–44)
AST: 27 IU/L (ref 0–40)
Albumin/Globulin Ratio: 2.3 — ABNORMAL HIGH (ref 1.2–2.2)
Albumin: 5.3 g/dL — ABNORMAL HIGH (ref 4.0–5.0)
Alkaline Phosphatase: 75 IU/L (ref 44–121)
BUN/Creatinine Ratio: 9 (ref 9–20)
BUN: 9 mg/dL (ref 6–24)
Bilirubin Total: 1.1 mg/dL (ref 0.0–1.2)
CO2: 25 mmol/L (ref 20–29)
Calcium: 10 mg/dL (ref 8.7–10.2)
Chloride: 100 mmol/L (ref 96–106)
Creatinine, Ser: 1.05 mg/dL (ref 0.76–1.27)
Globulin, Total: 2.3 g/dL (ref 1.5–4.5)
Glucose: 133 mg/dL — ABNORMAL HIGH (ref 70–99)
Potassium: 4.4 mmol/L (ref 3.5–5.2)
Sodium: 139 mmol/L (ref 134–144)
Total Protein: 7.6 g/dL (ref 6.0–8.5)
eGFR: 88 mL/min/{1.73_m2} (ref >59)

## 2021-12-01 LAB — TSH+FREE T4
Free T4: 1.33 ng/dL (ref 0.82–1.77)
TSH: 0.374 u[IU]/mL — ABNORMAL LOW (ref 0.450–4.500)

## 2021-12-01 LAB — THYROID STIMULATING IMMUNOGLOBULIN: Thyroid Stim Immunoglobulin: <0.1 IU/L (ref 0.00–0.55)

## 2021-12-20 ENCOUNTER — Encounter: Payer: Self-pay | Admitting: Physician Assistant

## 2021-12-22 ENCOUNTER — Other Ambulatory Visit: Payer: Self-pay | Admitting: Physician Assistant

## 2021-12-22 DIAGNOSIS — L299 Pruritus, unspecified: Secondary | ICD-10-CM

## 2021-12-22 MED ORDER — AMMONIUM LACTATE 12 % EX CREA: TOPICAL_CREAM | CUTANEOUS | 0 refills | Status: DC | PRN

## 2021-12-22 NOTE — Progress Notes (Signed)
Pt requested [facility]

## 2022-02-09 ENCOUNTER — Other Ambulatory Visit: Payer: Self-pay | Admitting: Physician Assistant

## 2022-02-09 DIAGNOSIS — L299 Pruritus, unspecified: Secondary | ICD-10-CM

## 2022-02-21 ENCOUNTER — Ambulatory Visit (INDEPENDENT_AMBULATORY_CARE_PROVIDER_SITE_OTHER): Payer: Managed Care, Other (non HMO) | Admitting: Physician Assistant

## 2022-02-21 ENCOUNTER — Encounter: Payer: Self-pay | Admitting: Physician Assistant

## 2022-02-21 ENCOUNTER — Other Ambulatory Visit: Payer: Self-pay | Admitting: Physician Assistant

## 2022-02-21 VITALS — BP 144/95 | HR 96 | Ht 71.0 in | Wt 209.3 lb

## 2022-02-21 DIAGNOSIS — E1159 Type 2 diabetes mellitus with other circulatory complications: Secondary | ICD-10-CM | POA: Diagnosis not present

## 2022-02-21 DIAGNOSIS — L299 Pruritus, unspecified: Secondary | ICD-10-CM

## 2022-02-21 DIAGNOSIS — E1129 Type 2 diabetes mellitus with other diabetic kidney complication: Secondary | ICD-10-CM | POA: Diagnosis not present

## 2022-02-21 DIAGNOSIS — R809 Proteinuria, unspecified: Secondary | ICD-10-CM | POA: Diagnosis not present

## 2022-02-21 DIAGNOSIS — I152 Hypertension secondary to endocrine disorders: Secondary | ICD-10-CM | POA: Diagnosis not present

## 2022-02-21 LAB — POCT GLYCOSYLATED HEMOGLOBIN (HGB A1C): Hemoglobin A1C: 7.4 % — AB (ref 4.0–5.6)

## 2022-02-21 NOTE — Progress Notes (Signed)
? ?I,Sha'taria Tyson,acting as a scribe for Lindsay Drubel, PA-C.,have documented all relevant documentation on the behalf of Lindsay Drubel, PA-C,as directed by  Lindsay Drubel, PA-C while in the presence of Lindsay Drubel, PA-C.  ? ?Established Patient Office Visit ? ?Subjective:  ?Patient ID: Bradley Carroll, male    DOB: 09/17/1971  Age: 50 y.o. MRN: [id number] ? ?CC:Bradley Carroll presents for 3 month hypertension and diabetes follow-up. ? ?HPI ? ?Hypertension, follow-up ? ?BP Readings from Last 3 Encounters:  ?02/21/22 (!) 144/95  ?11/23/21 (!) 137/107  ?01/03/21 (!) 146/113  ? Wt Readings from Last 3 Encounters:  ?02/21/22 209 lb 4.8 oz (94.9 kg)  ?11/23/21 212 lb 11.2 oz (96.5 kg)  ?01/03/21 204 lb (92.5 kg)  ?  ? ?He was last seen for hypertension 3 months ago.  ?BP at that visit was 137/07. Management since that visit includes start amlodipine 5 mg and occassionally check BP at home. Reports outside Bps are lower but does not recall any exact values or have any written. ? ?He reports excellent compliance with treatment. ?He is not having side effects.  ?He is following a Low Sodium, Diabetic diet. ?He is exercising. ?He does not smoke. ? ?Use of agents associated with hypertension: none.  ? ?Outside blood pressures are being checked sometimes and lower than last viist. ?Symptoms: ?No chest pain No chest pressure  ?No palpitations No syncope  ?No dyspnea No orthopnea  ?No paroxysmal nocturnal dyspnea No lower extremity edema  ? ?Pertinent labs ?Lab Results  ?Component Value Date  ? CHOL 139 11/28/2021  ? HDL 72 11/28/2021  ? LDLCALC 35 11/28/2021  ? LDLDIRECT 81.0 11/12/2015  ? TRIG 207 (H) 11/28/2021  ? CHOLHDL 1.9 11/28/2021  ? Lab Results  ?Component Value Date  ? NA 139 11/28/2021  ? K 4.4 11/28/2021  ? CREATININE 1.05 11/28/2021  ? EGFR 88 11/28/2021  ? GLUCOSE 133 (H) 11/28/2021  ? TSH 0.374 (L) 11/28/2021  ?  ? ?The 10-year ASCVD risk score (Arnett DK, et al., 2019) is:  8.9% ? ?---------------------------------------------------------------------------------------------------  ?Diabetes Mellitus Type II, Follow-up ? ?Lab Results  ?Component Value Date  ? HGBA1C 7.4 (A) 02/21/2022  ? HGBA1C 8.4 (A) 11/23/2021  ? HGBA1C 8.1 (H) 01/03/2021  ? ?Wt Readings from Last 3 Encounters:  ?02/21/22 209 lb 4.8 oz (94.9 kg)  ?11/23/21 212 lb 11.2 oz (96.5 kg)  ?01/03/21 204 lb (92.5 kg)  ? ?Last seen for diabetes 3 months ago.  ?Management since then includes increase metformin to 1000 mg BID. ?He reports excellent compliance with treatment. ?He is not having side effects.  ?Symptoms: ?No fatigue No foot ulcerations  ?No appetite changes No nausea  ?No paresthesia of the feet  No polydipsia  ?No polyuria No visual disturbances   ?No vomiting   ? ? ?Home blood sugar records:  none ? ?Episodes of hypoglycemia? No  ?  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 ?Pertinent Labs: ?Lab Results  ?Component Value Date  ? CHOL 139 11/28/2021  ? HDL 72 11/28/2021  ? LDLCALC 35 11/28/2021  ? LDLDIRECT 81.0 11/12/2015  ? TRIG 207 (H) 11/28/2021  ? CHOLHDL 1.9 11/28/2021  ? Lab Results  ?Component Value Date  ? NA 139 11/28/2021  ? K 4.4 11/28/2021  ? CREATININE 1.05 11/28/2021  ? EGFR 88 11/28/2021  ? MICROALBUR 20 11/05/2019  ?  ? ?--------------------------------------------------------------------------------------------------- ? ? ?  Past Medical History:  ?Diagnosis Date  ? Allergy   ? Asthma   ? Osteoarthritis   ? knees  ? Pre-diabetes   ? ? ?Past Surgical History:  ?Procedure Laterality Date  ? COLONOSCOPY WITH PROPOFOL N/A 02/16/2020  ? Procedure: COLONOSCOPY WITH PROPOFOL;  Surgeon: Wohl, Darren, MD;  Location: [facility];  Service: Endoscopy;  Laterality: N/A;  ? KNEE ARTHROSCOPY W/ PARTIAL MEDIAL MENISCECTOMY  2005  ? ? ?Family History  ?Problem  Relation Age of Onset  ? Diabetes Father   ? Hypertension Father   ? Congestive Heart Failure Father   ? Diabetes Mother   ? Diabetes Sister   ? Diabetes Sister   ? Colon cancer Neg Hx   ? Prostate cancer Neg Hx   ? ? ?Social History  ? ?Socioeconomic History  ? Marital status: Married  ?  Spouse name: Not on file  ? Number of children: Not on file  ? Years of education: Not on file  ? Highest education level: Not on file  ?Occupational History  ? Not on file  ?Tobacco Use  ? Smoking status: Former  ?  Packs/day: 0.10  ?  Years: 4.00  ?  Pack years: 0.40  ?  Types: Cigarettes  ? Smokeless tobacco: Former  ?Vaping Use  ? Vaping Use: Never used  ?Substance and Sexual Activity  ? Alcohol use: Yes  ?  Alcohol/week: 10.0 standard drinks  ?  Types: 10 Cans of beer per week  ? Drug use: No  ? Sexual activity: Not on file  ?Other Topics Concern  ? Not on file  ?Social History Narrative  ? Lives in [place] with wife and 2 kids.  Works as electrical engineer.  Likes golf, basketball  ? ?Social Determinants of Health  ? ?Financial Resource Strain: Not on file  ?Food Insecurity: Not on file  ?Transportation Needs: Not on file  ?Physical Activity: Not on file  ?Stress: Not on file  ?Social Connections: Not on file  ?Intimate Partner Violence: Not on file  ? ? ?Outpatient Medications Prior to Visit  ?Medication Sig Dispense Refill  ? amLODipine (NORVASC) 5 MG tablet Take 1 tablet (5 mg total) by mouth daily. 90 tablet 1  ? ammonium lactate (AMLACTIN) 12 % cream APPLY TOPICALLY AS NEEDED FOR  DRY SKIN 280 g 2  ? atorvastatin (LIPITOR) 40 MG tablet Take 1 tablet (40 mg total) by mouth daily. 90 tablet 3  ? meloxicam (MOBIC) 15 MG tablet Take 1 tablet (15 mg total) by mouth daily. 90 tablet 1  ? metFORMIN (GLUCOPHAGE) 1000 MG tablet Take 1 tablet (1,000 mg total) by mouth 2 (two) times daily with a meal. 180 tablet 3  ? ?No facility-administered medications prior to visit.  ? ? ?Allergies  ?Allergen Reactions  ? Other Other (See  Comments)  ?  Shellfish-upset stomach.  ? Shellfish Allergy Nausea And Vomiting  ? ? ?ROS ?Review of Systems  ?Constitutional:  Negative for fatigue and fever.  ?Respiratory:  Negative for cough and shortness of breath.   ?Cardiovascular:  Negative for chest pain, palpitations and leg swelling.  ?Neurological:  Negative for dizziness and headaches.  ? ?  ?Objective:  ?  ?Physical Exam ?Constitutional:   ?   Appearance: Normal appearance. He is not ill-appearing.  ?HENT:  ?   Head: Normocephalic.  ?Eyes:  ?   Conjunctiva/sclera: Conjunctivae normal.  ?Cardiovascular:  ?   Rate and Rhythm: Normal rate and regular rhythm.  ?     Pulses: Normal pulses.  ?Pulmonary:  ?   Effort: Pulmonary effort is normal.  ?   Breath sounds: Normal breath sounds.  ?Musculoskeletal:  ?   Right lower leg: No edema.  ?   Left lower leg: No edema.  ?Neurological:  ?   Mental Status: He is oriented to person, place, and time.  ?Psychiatric:     ?   Mood and Affect: Mood normal.     ?   Behavior: Behavior normal.  ? ? ?BP (!) 144/95 (BP Location: Right Arm, Patient Position: Sitting, Cuff Size: Normal)   Pulse 96   Ht 5' 11" (1.803 m)   Wt 209 lb 4.8 oz (94.9 kg)   BMI 29.19 kg/m?  ?Wt Readings from Last 3 Encounters:  ?02/21/22 209 lb 4.8 oz (94.9 kg)  ?11/23/21 212 lb 11.2 oz (96.5 kg)  ?01/03/21 204 lb (92.5 kg)  ? ? ? ?Health Maintenance Due  ?Topic Date Due  ? FOOT EXAM  11/04/2020  ? URINE MICROALBUMIN  11/04/2020  ? OPHTHALMOLOGY EXAM  01/06/2022  ? ? ?There are no preventive care reminders to display for this patient. ? ?Lab Results  ?Component Value Date  ? TSH 0.374 (L) 11/28/2021  ? ?Lab Results  ?Component Value Date  ? WBC 3.9 01/03/2021  ? HGB 16.8 01/03/2021  ? HCT 49.9 01/03/2021  ? MCV 90 01/03/2021  ? PLT 238 01/03/2021  ? ?Lab Results  ?Component Value Date  ? NA 139 11/28/2021  ? K 4.4 11/28/2021  ? CO2 25 11/28/2021  ? GLUCOSE 133 (H) 11/28/2021  ? BUN 9 11/28/2021  ? CREATININE 1.05 11/28/2021  ? BILITOT 1.1 11/28/2021   ? ALKPHOS 75 11/28/2021  ? AST 27 11/28/2021  ? ALT 25 11/28/2021  ? PROT 7.6 11/28/2021  ? ALBUMIN 5.3 (H) 11/28/2021  ? CALCIUM 10.0 11/28/2021  ? EGFR 88 11/28/2021  ? GFR 87.65 11/12/2015  ? ?Lab Results  ?Component Value Date  ? CHOL 139 0

## 2022-02-21 NOTE — Assessment & Plan Note (Signed)
Will reassess urine micro ?A1c improved continue metformin 1000 mg bid ?Advised in order to avoid a second agent, to focus on diet/activity.  ?F/u 3 mo ?

## 2022-02-21 NOTE — Assessment & Plan Note (Signed)
Still elevated in office, advised pt to check more often at home and record values to bring to next visit. ?Continue amlodipine 5 mg ?

## 2022-02-24 LAB — MICROALBUMIN / CREATININE URINE RATIO
Creatinine, Urine: 136 mg/dL
Microalb/Creat Ratio: 32 mg/g creat — ABNORMAL HIGH (ref 0–29)
Microalbumin, Urine: 43.4 ug/mL

## 2022-02-27 ENCOUNTER — Other Ambulatory Visit: Payer: Self-pay | Admitting: Physician Assistant

## 2022-02-27 DIAGNOSIS — E1129 Type 2 diabetes mellitus with other diabetic kidney complication: Secondary | ICD-10-CM

## 2022-02-27 MED ORDER — LISINOPRIL 5 MG PO TABS: 5.0000 mg | ORAL_TABLET | Freq: Every day | ORAL | 3 refills | Status: DC

## 2022-04-06 ENCOUNTER — Other Ambulatory Visit: Payer: Self-pay | Admitting: Physician Assistant

## 2022-04-06 DIAGNOSIS — I152 Hypertension secondary to endocrine disorders: Secondary | ICD-10-CM

## 2022-05-24 ENCOUNTER — Encounter: Payer: Self-pay | Admitting: Physician Assistant

## 2022-05-24 ENCOUNTER — Ambulatory Visit (INDEPENDENT_AMBULATORY_CARE_PROVIDER_SITE_OTHER): Payer: Managed Care, Other (non HMO) | Admitting: Physician Assistant

## 2022-05-24 VITALS — BP 123/75 | HR 93 | Ht 70.5 in | Wt 198.9 lb

## 2022-05-24 DIAGNOSIS — E1159 Type 2 diabetes mellitus with other circulatory complications: Secondary | ICD-10-CM | POA: Diagnosis not present

## 2022-05-24 DIAGNOSIS — E1169 Type 2 diabetes mellitus with other specified complication: Secondary | ICD-10-CM | POA: Diagnosis not present

## 2022-05-24 DIAGNOSIS — R7989 Other specified abnormal findings of blood chemistry: Secondary | ICD-10-CM

## 2022-05-24 DIAGNOSIS — R809 Proteinuria, unspecified: Secondary | ICD-10-CM

## 2022-05-24 DIAGNOSIS — Z0000 Encounter for general adult medical examination without abnormal findings: Secondary | ICD-10-CM | POA: Diagnosis not present

## 2022-05-24 DIAGNOSIS — L608 Other nail disorders: Secondary | ICD-10-CM

## 2022-05-24 DIAGNOSIS — I152 Hypertension secondary to endocrine disorders: Secondary | ICD-10-CM

## 2022-05-24 DIAGNOSIS — E785 Hyperlipidemia, unspecified: Secondary | ICD-10-CM

## 2022-05-24 DIAGNOSIS — E1129 Type 2 diabetes mellitus with other diabetic kidney complication: Secondary | ICD-10-CM | POA: Diagnosis not present

## 2022-05-24 DIAGNOSIS — Z125 Encounter for screening for malignant neoplasm of prostate: Secondary | ICD-10-CM

## 2022-05-24 NOTE — Progress Notes (Signed)
I,Bradley Carroll,acting as a Neurosurgeon for [employer], PA-C.,have documented all relevant documentation on the behalf of Bradley Ferguson, PA-C,as directed by  Bradley Ferguson, PA-C while in the presence of Bradley Ferguson, PA-C.   Complete physical exam   Patient: Bradley Carroll   DOB: January 02, 1971   51 y.o. Male  MRN: [id number] Visit Date: 05/24/2022  Today's healthcare provider: [name], PA-C   Cc. cpe  Subjective    Bradley Carroll is a 51 y.o. male who presents today for a complete physical exam.  He reports consuming a general diet. The patient has a physically strenuous job, but has no regular exercise apart from work.  He generally feels well. He reports sleeping well. He does not have additional problems to discuss today.    Past Medical History:  Diagnosis Date   Allergy    Asthma    Osteoarthritis    knees   Pre-diabetes    Past Surgical History:  Procedure Laterality Date   COLONOSCOPY WITH PROPOFOL N/A 02/16/2020   Procedure: COLONOSCOPY WITH PROPOFOL;  Surgeon: [name], MD;  Location: [facility];  Service: Endoscopy;  Laterality: N/A;   KNEE ARTHROSCOPY W/ PARTIAL MEDIAL MENISCECTOMY  2005   Social History   Socioeconomic History   Marital status: Married    Spouse name: Not on file   Number of children: Not on file   Years of education: Not on file   Highest education level: Not on file  Occupational History   Not on file  Tobacco Use   Smoking status: Former    Packs/day: 0.10    Years: 4.00    Total pack years: 0.40    Types: Cigarettes   Smokeless tobacco: Former  Building services engineer Use: Never used  Substance and Sexual Activity   Alcohol use: Yes    Alcohol/week: 10.0 standard drinks of alcohol    Types: 10 Cans of beer per week   Drug use: No   Sexual activity: Not on file  Other Topics Concern   Not on file  Social History Narrative   Lives in [place] with wife and 2 kids.  Works as Acupuncturist.  Likes golf,  basketball   Social Determinants of Corporate investment banker Strain: Not on file  Food Insecurity: Not on file  Transportation Needs: Not on file  Physical Activity: Not on file  Stress: Not on file  Social Connections: Not on file  Intimate Partner Violence: Not on file   Family Status  Relation Name Status   Father  Deceased       CHF   Mother  (Not Specified)   Sister  Alive   Sister  Alive   Sister  Alive   Sister  Alive   Neg Hx  (Not Specified)   Family History  Problem Relation Age of Onset   Diabetes Father    Hypertension Father    Congestive Heart Failure Father    Diabetes Mother    Diabetes Sister    Diabetes Sister    Colon cancer Neg Hx    Prostate cancer Neg Hx    Allergies  Allergen Reactions   Other Other (See Comments)    Shellfish-upset stomach.   Shellfish Allergy Nausea And Vomiting    Patient Care Team: Bradley Ferguson, PA-C as PCP - General (Physician Assistant) Pa, [facility])   Medications: Outpatient Medications Prior to Visit  Medication Sig   amLODipine (NORVASC) 5 MG tablet  TAKE 1 TABLET BY MOUTH DAILY   ammonium lactate (AMLACTIN) 12 % cream APPLY TOPICALLY AS NEEDED FOR  DRY SKIN   atorvastatin (LIPITOR) 40 MG tablet Take 1 tablet (40 mg total) by mouth daily.   lisinopril (ZESTRIL) 5 MG tablet Take 1 tablet (5 mg total) by mouth daily.   meloxicam (MOBIC) 15 MG tablet Take 1 tablet by mouth daily.   metFORMIN (GLUCOPHAGE) 1000 MG tablet Take 1 tablet (1,000 mg total) by mouth 2 (two) times daily with a meal.   No facility-administered medications prior to visit.    Review of Systems  Genitourinary:  Positive for frequency.  All other systems reviewed and are negative.    Objective     Blood pressure 123/75, pulse 93, height 5' 10.5" (1.791 m), weight 198 lb 14.4 oz (90.2 kg), SpO2 100 %.    Physical Exam Constitutional:      General: He is awake.     Appearance: He is well-developed.  HENT:      Head: Normocephalic.     Right Ear: Tympanic membrane, ear canal and external ear normal.     Left Ear: Tympanic membrane, ear canal and external ear normal.     Nose: Nose normal. No congestion or rhinorrhea.     Mouth/Throat:     Mouth: Mucous membranes are moist.     Pharynx: No oropharyngeal exudate or posterior oropharyngeal erythema.  Eyes:     Pupils: Pupils are equal, round, and reactive to light.  Cardiovascular:     Rate and Rhythm: Normal rate and regular rhythm.     Pulses:          Dorsalis pedis pulses are 2+ on the right side and 2+ on the left side.     Heart sounds: Normal heart sounds.  Pulmonary:     Effort: Pulmonary effort is normal.     Breath sounds: Normal breath sounds.  Abdominal:     General: There is no distension.     Palpations: Abdomen is soft.     Tenderness: There is no abdominal tenderness. There is no guarding.  Musculoskeletal:     Cervical back: Normal range of motion.     Right lower leg: No edema.     Left lower leg: No edema.  Feet:     Right foot:     Protective Sensation: 4 sites tested.  4 sites sensed.     Skin integrity: Skin integrity normal.     Toenail Condition: Right toenails are abnormally thick.     Left foot:     Protective Sensation: 4 sites tested.  4 sites sensed.     Skin integrity: Skin integrity normal.     Toenail Condition: Left toenails are abnormally thick.  Lymphadenopathy:     Cervical: No cervical adenopathy.  Skin:    General: Skin is warm.  Neurological:     Mental Status: He is alert and oriented to person, place, and time.  Psychiatric:        Attention and Perception: Attention normal.        Mood and Affect: Mood normal.        Speech: Speech normal.        Behavior: Behavior normal. Behavior is cooperative.     Last depression screening scores    05/24/2022    9:34 AM 02/21/2022   10:14 AM 01/03/2021    9:39 AM  PHQ 2/9 Scores  PHQ - 2 Score 0 0 0  PHQ-  9 Score 0 0 0   Last fall risk  screening    05/24/2022    9:34 AM  [place] in the past year? 0  Number falls in past yr: 0  Injury with Fall? 0  Risk for fall due to : No Fall Risks   Last Audit-C alcohol use screening    05/24/2022    9:34 AM  Alcohol Use Disorder Test (AUDIT)  1. How often do you have a drink containing alcohol? 2  2. How many drinks containing alcohol do you have on a typical day when you are drinking? 0  3. How often do you have six or more drinks on one occasion? 1  AUDIT-C Score 3   A score of 3 or more in women, and 4 or more in men indicates increased risk for alcohol abuse, EXCEPT if all of the points are from question 1   No results found for any visits on 05/24/22.  Assessment & Plan    Routine Health Maintenance and Physical Exam  Exercise Activities and Dietary recommendations --balanced diet high in fiber and protein, low in sugars, carbs, fats. --physical activity/exercise 30 minutes 3-5 times a week     Immunization History  Administered Date(s) Administered   Influenza, Seasonal, Injecte, Preservative Fre 08/20/2016   Influenza,inj,Quad PF,6+ Mos 07/31/2013, 09/26/2017   Influenza-Unspecified 11/26/2018, 09/03/2020, 08/22/2021   MMR 03/02/1993   PFIZER(Purple Top)SARS-COV-2 Vaccination 12/25/2019, 01/15/2020, 09/02/2020   Pneumococcal Polysaccharide-23 11/05/2019   Tdap 11/27/2012    Health Maintenance  Topic Date Due   OPHTHALMOLOGY EXAM  01/06/2022   COVID-19 Vaccine (4 - Pfizer series) 06/09/2022 (Originally 10/28/2020)   INFLUENZA VACCINE  06/20/2022   HEMOGLOBIN A1C  08/23/2022   TETANUS/TDAP  11/27/2022   FOOT EXAM  05/25/2023   COLONOSCOPY (Pts 45-59yrs Insurance coverage will need to be confirmed)  02/15/2030   Hepatitis C Screening  Completed   HIV Screening  Completed   HPV VACCINES  Aged Out    Discussed health benefits of physical activity, and encouraged him to engage in regular exercise appropriate for his age and condition.  Problem  List Items Addressed This Visit       Cardiovascular and Mediastinum   Hypertension associated with diabetes ([place])    Elevated in office -- home values WNL.  Continue current medications  Reviewed last cmp F/u 6 mo      Relevant Orders   Comprehensive Metabolic Panel (CMET)     Endocrine   Type 2 diabetes mellitus with microalbuminuria, without long-term current use of insulin (HCC)    Will check A1c w/ bw today. Goal is < 6.5%.  Currently managing with metformin 1000 mg BID.  Taking lisinopril to manage microalbuminuria Taking statin  Foot exam completed today uacr w/in year.Marland Kitchen Pt aware he needs f/u with optho last 2/22 Lipids at goal F/u depending on A1c      Relevant Orders   HgB A1c   Ambulatory referral to Podiatry   Hyperlipidemia associated with type 2 diabetes mellitus ([place])    Last lipid panel at goal Continue atorvastatin 40 mg.  The 10-year ASCVD risk score (Arnett DK, et al., 2019) is: 8.1%         Musculoskeletal and Integument   Toenail deformity    Very thick discolored toenails Discussed w pt referring to podiatry.       Relevant Orders   Ambulatory referral to Podiatry     Other   Abnormal thyroid stimulating  hormone level    Will recheck tsh/t4 today to ensure stability or back to normal      Relevant Orders   TSH + free T4   Other Visit Diagnoses     Annual physical exam    -  Primary   Prostate cancer screening       Relevant Orders   PSA        Return in about 6 months (around 11/24/2022), or pending bw, for chronic conditions.     I, Mikey Kirschner, PA-C have reviewed all documentation for this visit. The documentation on  05/24/2022  for the exam, diagnosis, procedures, and orders are all accurate and complete.  Mikey Kirschner, PA-C [facility] [address] [place], [place], [address] Office: [phone] Fax: [place]

## 2022-05-24 NOTE — Assessment & Plan Note (Signed)
Very thick discolored toenails Discussed w pt referring to podiatry.

## 2022-05-24 NOTE — Assessment & Plan Note (Signed)
Last lipid panel at goal Continue atorvastatin 40 mg.  The 10-year ASCVD risk score (Arnett DK, et al., 2019) is: 8.1%

## 2022-05-24 NOTE — Assessment & Plan Note (Addendum)
Elevated in office -- home values WNL.  Continue current medications  Reviewed last cmp F/u 6 mo

## 2022-05-24 NOTE — Assessment & Plan Note (Signed)
Will recheck tsh/t4 today to ensure stability or back to normal

## 2022-05-24 NOTE — Assessment & Plan Note (Addendum)
Will check A1c w/ bw today. Goal is < 6.5%.  Currently managing with metformin 1000 mg BID.  Taking lisinopril to manage microalbuminuria Taking statin  Foot exam completed today uacr w/in year.Marland Kitchen Pt aware he needs f/u with optho last 2/22 Lipids at goal F/u depending on A1c

## 2022-05-25 LAB — COMPREHENSIVE METABOLIC PANEL
ALT: 28 IU/L (ref 0–44)
AST: 31 IU/L (ref 0–40)
Albumin/Globulin Ratio: 2.2 (ref 1.2–2.2)
Albumin: 4.8 g/dL (ref 4.0–5.0)
Alkaline Phosphatase: 71 IU/L (ref 44–121)
BUN/Creatinine Ratio: 18 (ref 9–20)
BUN: 20 mg/dL (ref 6–24)
Bilirubin Total: 0.9 mg/dL (ref 0.0–1.2)
CO2: 22 mmol/L (ref 20–29)
Calcium: 10 mg/dL (ref 8.7–10.2)
Chloride: 100 mmol/L (ref 96–106)
Creatinine, Ser: 1.11 mg/dL (ref 0.76–1.27)
Globulin, Total: 2.2 g/dL (ref 1.5–4.5)
Glucose: 102 mg/dL — ABNORMAL HIGH (ref 70–99)
Potassium: 5.4 mmol/L — ABNORMAL HIGH (ref 3.5–5.2)
Sodium: 137 mmol/L (ref 134–144)
Total Protein: 7 g/dL (ref 6.0–8.5)
eGFR: 81 mL/min/{1.73_m2} (ref >59)

## 2022-05-25 LAB — HEMOGLOBIN A1C
Est. average glucose Bld gHb Est-mCnc: 177 mg/dL
Hgb A1c MFr Bld: 7.8 % — ABNORMAL HIGH (ref 4.8–5.6)

## 2022-05-25 LAB — PSA: Prostate Specific Ag, Serum: 1.6 ng/mL (ref 0.0–4.0)

## 2022-05-25 LAB — TSH+FREE T4
Free T4: 1.21 ng/dL (ref 0.82–1.77)
TSH: 0.298 u[IU]/mL — ABNORMAL LOW (ref 0.450–4.500)

## 2022-05-26 ENCOUNTER — Other Ambulatory Visit: Payer: Self-pay | Admitting: Physician Assistant

## 2022-05-26 ENCOUNTER — Ambulatory Visit: Payer: Self-pay | Admitting: *Deleted

## 2022-05-26 DIAGNOSIS — E875 Hyperkalemia: Secondary | ICD-10-CM

## 2022-05-26 DIAGNOSIS — E1129 Type 2 diabetes mellitus with other diabetic kidney complication: Secondary | ICD-10-CM

## 2022-05-26 MED ORDER — EMPAGLIFLOZIN 10 MG PO TABS: 10.0000 mg | ORAL_TABLET | Freq: Every day | ORAL | 1 refills | Status: DC

## 2022-05-26 NOTE — Telephone Encounter (Signed)
Pt given lab results per notes of L. Drubel  from 05/25/22 on 05/26/22. Pt verbalized understanding and reviewed message in My Chart on 05/26/22 at 11:14 am. Patient reports he is ok to add jardiance 10 mg to metformin.

## 2022-05-26 NOTE — Telephone Encounter (Signed)
Patient advised.

## 2022-06-09 ENCOUNTER — Ambulatory Visit (INDEPENDENT_AMBULATORY_CARE_PROVIDER_SITE_OTHER): Payer: Managed Care, Other (non HMO) | Admitting: Podiatry

## 2022-06-09 ENCOUNTER — Encounter: Payer: Self-pay | Admitting: Podiatry

## 2022-06-09 DIAGNOSIS — B351 Tinea unguium: Secondary | ICD-10-CM | POA: Diagnosis not present

## 2022-06-09 MED ORDER — TERBINAFINE HCL 250 MG PO TABS
250.0000 mg | ORAL_TABLET | Freq: Every day | ORAL | 0 refills | Status: DC
Start: 2022-06-09 — End: 2024-06-12

## 2022-06-09 NOTE — Progress Notes (Signed)
   Subjective: 51 y.o. male presenting today as a new patient for evaluation of thick discoloration to the bilateral toenails.  This has been present for several years.  He has not done anything for treatment.  Patient also states that he does get some dry fissuring of the skin to the bilateral heels.  He currently applies ammonium lactate with minimal improvement  Past Medical History:  Diagnosis Date   Allergy    Asthma    Osteoarthritis    knees   Pre-diabetes     Past Surgical History:  Procedure Laterality Date   COLONOSCOPY WITH PROPOFOL N/A 02/16/2020   Procedure: COLONOSCOPY WITH PROPOFOL;  Surgeon: [name], MD;  Location: [facility];  Service: Endoscopy;  Laterality: N/A;   KNEE ARTHROSCOPY W/ PARTIAL MEDIAL MENISCECTOMY  2005    Allergies  Allergen Reactions   Other Other (See Comments)    Shellfish-upset stomach.   Shellfish Allergy Nausea And Vomiting    Objective: Physical Exam General: The patient is alert and oriented x3 in no acute distress.  Dermatology: Hyperkeratotic, discolored, thickened, onychodystrophy noted bilateral toenails. Skin is warm, dry and supple bilateral lower extremities. Negative for open lesions or macerations.  There is also some hyperkeratotic fissuring of skin to the heels bilateral  Vascular: Palpable pedal pulses bilaterally. No edema or erythema noted. Capillary refill within normal limits.  Neurological: Epicritic and protective threshold grossly intact bilaterally.   Musculoskeletal Exam: No pedal deformity noted  Assessment: #1 Onychomycosis of toenails bilateral #2 superficial fissuring of skin bilateral heels   Plan of Care:  #1 Patient was evaluated. #2  Today we discussed different treatment options including oral, topical, and laser antifungal treatment modalities.  We discussed their efficacies and side effects.  Patient opts for oral antifungal treatment modality #3 prescription for Lamisil 250 mg #90  daily. Pt denies a history of liver pathology or symptoms.  Patient is otherwise healthy #4  OTC foot miracle lotion and revitaderm dispensed at checkout  #5 return to clinic as needed   Felecia Shelling, DPM [facility]  Dr. Felecia Shelling, DPM    [address] [place], [place] [address]                Office [phone]  Fax [phone]

## 2022-09-12 ENCOUNTER — Other Ambulatory Visit: Payer: Self-pay | Admitting: Physician Assistant

## 2022-09-12 ENCOUNTER — Encounter: Payer: Self-pay | Admitting: Physician Assistant

## 2022-09-12 DIAGNOSIS — L299 Pruritus, unspecified: Secondary | ICD-10-CM

## 2022-09-12 DIAGNOSIS — I152 Hypertension secondary to endocrine disorders: Secondary | ICD-10-CM

## 2022-09-12 DIAGNOSIS — E782 Mixed hyperlipidemia: Secondary | ICD-10-CM

## 2022-09-12 DIAGNOSIS — R809 Proteinuria, unspecified: Secondary | ICD-10-CM

## 2022-09-12 MED ORDER — AMLODIPINE BESYLATE 5 MG PO TABS: 5.0000 mg | ORAL_TABLET | Freq: Every day | ORAL | 3 refills | Status: DC

## 2022-09-12 MED ORDER — ATORVASTATIN CALCIUM 40 MG PO TABS: 40.0000 mg | ORAL_TABLET | Freq: Every day | ORAL | 3 refills | Status: DC

## 2022-09-12 MED ORDER — AMMONIUM LACTATE 12 % EX CREA: TOPICAL_CREAM | CUTANEOUS | 2 refills | Status: DC | PRN

## 2022-09-12 MED ORDER — EMPAGLIFLOZIN 10 MG PO TABS
10.0000 mg | ORAL_TABLET | Freq: Every day | ORAL | 1 refills | Status: DC
Start: 2022-09-12 — End: 2023-03-20

## 2022-09-12 MED ORDER — LISINOPRIL 5 MG PO TABS: 5.0000 mg | ORAL_TABLET | Freq: Every day | ORAL | 3 refills | Status: DC

## 2022-09-12 MED ORDER — METFORMIN HCL 1000 MG PO TABS: 1000.0000 mg | ORAL_TABLET | Freq: Two times a day (BID) | ORAL | 3 refills | Status: DC

## 2022-09-19 ENCOUNTER — Other Ambulatory Visit: Payer: Self-pay | Admitting: Physician Assistant

## 2022-09-19 DIAGNOSIS — E782 Mixed hyperlipidemia: Secondary | ICD-10-CM

## 2022-09-19 DIAGNOSIS — E1129 Type 2 diabetes mellitus with other diabetic kidney complication: Secondary | ICD-10-CM

## 2022-10-24 ENCOUNTER — Ambulatory Visit: Payer: Managed Care, Other (non HMO) | Admitting: Physician Assistant

## 2022-11-24 ENCOUNTER — Ambulatory Visit: Payer: Managed Care, Other (non HMO) | Admitting: Physician Assistant

## 2022-11-29 ENCOUNTER — Ambulatory Visit: Payer: Managed Care, Other (non HMO) | Admitting: Physician Assistant

## 2022-11-29 NOTE — Progress Notes (Deleted)
I,Sha'taria Scherrie [place],acting as a Education administrator for [employer], PA-C.,have documented all relevant documentation on the behalf of Mikey Kirschner, PA-C,as directed by  Mikey Kirschner, PA-C while in the presence of Mikey Kirschner, PA-C.   Established patient visit   Patient: Bradley Carroll   DOB: 09-05-1971   52 y.o. Male  MRN: [id number] Visit Date: 11/29/2022  Today's healthcare provider: Mikey Kirschner, PA-C   No chief complaint on file.  Subjective    HPI  Diabetes Mellitus Type II, Follow-up  Lab Results  Component Value Date   HGBA1C 7.8 (H) 05/24/2022   HGBA1C 7.4 (A) 02/21/2022   HGBA1C 8.4 (A) 11/23/2021   Wt Readings from Last 3 Encounters:  05/24/22 198 lb 14.4 oz (90.2 kg)  02/21/22 209 lb 4.8 oz (94.9 kg)  11/23/21 212 lb 11.2 oz (96.5 kg)   Last seen for diabetes 6 months ago.  Management since then includes add jardiance 10 mg to metformin . He reports {excellent/good/fair/poor:19665} compliance with treatment. He {is/is not:21021397} having side effects. {document side effects if present:1} Symptoms: {Yes/No:20286} fatigue {Yes/No:20286} foot ulcerations  {Yes/No:20286} appetite changes {Yes/No:20286} nausea  {Yes/No:20286} paresthesia of the feet  {Yes/No:20286} polydipsia  {Yes/No:20286} polyuria {Yes/No:20286} visual disturbances   {Yes/No:20286} vomiting     Home blood sugar records: {diabetes glucometry results:16657}  Episodes of hypoglycemia? {Yes/No:20286} {enter symptoms and frequency of symptoms if yes:1}   Current insulin regiment: {none} Most Recent Eye Exam: *** Pertinent Labs: Lab Results  Component Value Date   CHOL 139 11/28/2021   HDL 72 11/28/2021   LDLCALC 35 11/28/2021   LDLDIRECT 81.0 11/12/2015   TRIG 207 (H) 11/28/2021   CHOLHDL 1.9 11/28/2021   Lab Results  Component Value Date   NA 137 05/24/2022   K 5.4 (H) 05/24/2022   CREATININE 1.11 05/24/2022   EGFR 81 05/24/2022   MICROALBUR 20 11/05/2019   LABMICR 43.4 02/21/2022      ---------------------------------------------------------------------------------------------------  Hypertension, follow-up  BP Readings from Last 3 Encounters:  05/24/22 123/75  02/21/22 (!) 144/95  11/23/21 (!) 137/107   Wt Readings from Last 3 Encounters:  05/24/22 198 lb 14.4 oz (90.2 kg)  02/21/22 209 lb 4.8 oz (94.9 kg)  11/23/21 212 lb 11.2 oz (96.5 kg)     He was last seen for hypertension 6 months ago.  BP at that visit was 123/75. Management since that visit includes continue current treatment.  Use of agents associated with hypertension: {bp agents assoc with hypertension:511::"none"}.   Outside blood pressures are {***enter patient reported home BP readings, or 'not being checked':1}. Symptoms: {Yes/No:20286} chest pain {Yes/No:20286} chest pressure  {Yes/No:20286} palpitations {Yes/No:20286} syncope  {Yes/No:20286} dyspnea {Yes/No:20286} orthopnea  {Yes/No:20286} paroxysmal nocturnal dyspnea {Yes/No:20286} lower extremity edema  ---------------------------------------------------------------------------------------------------   Medications: Outpatient Medications Prior to Visit  Medication Sig   amLODipine (NORVASC) 5 MG tablet Take 1 tablet (5 mg total) by mouth daily.   ammonium lactate (AMLACTIN) 12 % cream Apply topically as needed for dry skin.   atorvastatin (LIPITOR) 40 MG tablet Take 1 tablet (40 mg total) by mouth daily.   empagliflozin (JARDIANCE) 10 MG TABS tablet Take 1 tablet (10 mg total) by mouth daily before breakfast.   lisinopril (ZESTRIL) 5 MG tablet Take 1 tablet (5 mg total) by mouth daily.   meloxicam (MOBIC) 15 MG tablet Take 1 tablet by mouth daily.   metFORMIN (GLUCOPHAGE) 1000 MG tablet Take 1 tablet (1,000 mg total) by mouth 2 (two) times daily with a meal.  terbinafine (LAMISIL) 250 MG tablet Take 1 tablet (250 mg total) by mouth daily.   No facility-administered medications prior to visit.    Review of Systems  {Labs   Heme  Chem  Endocrine  Serology  Results Review (optional):23779}   Objective    There were no vitals taken for this visit. {Show previous vital signs (optional):23777}  Physical Exam  ***  No results found for any visits on 11/29/22.  Assessment & Plan     ***  No follow-ups on file.      {provider attestation***:1}   Mikey Kirschner, PA-C  [facility] [phone] (phone) [phone] (fax)  [place]

## 2022-12-13 ENCOUNTER — Ambulatory Visit: Payer: Managed Care, Other (non HMO) | Admitting: Physician Assistant

## 2022-12-13 NOTE — Progress Notes (Unsigned)
I,Sha'taria Tyson,acting as a Education administrator for [employer], PA-C.,have documented all relevant documentation on the behalf of Mikey Kirschner, PA-C,as directed by  Mikey Kirschner, PA-C while in the presence of Mikey Kirschner, PA-C.   Established patient visit   Patient: Bradley Carroll   DOB: Jan 12, 1971   52 y.o. Male  MRN: [id number] Visit Date: 12/14/2022  Today's healthcare provider: Mikey Kirschner, PA-C   DM II f/u  Subjective    HPI  Diabetes Mellitus Type II, Follow-up  Lab Results  Component Value Date   HGBA1C 7.8 (H) 05/24/2022   HGBA1C 7.4 (A) 02/21/2022   HGBA1C 8.4 (A) 11/23/2021   Wt Readings from Last 3 Encounters:  12/14/22 203 lb 9.6 oz (92.4 kg)  05/24/22 198 lb 14.4 oz (90.2 kg)  02/21/22 209 lb 4.8 oz (94.9 kg)   Last seen for diabetes 6 months ago.  Management since then includes currently managing with metformin 1000 mg BID. After lab results advised to add jardiance 10 mg to pt's metformin  He reports excellent compliance with treatment. He is not having side effects. Symptoms: No fatigue No foot ulcerations  No appetite changes No nausea  No paresthesia of the feet  No polydipsia  Yes polyuria No visual disturbances   No vomiting     Home blood sugar records:  not being checked  Episodes of hypoglycemia? No    Current insulin regiment: none Most Recent Eye Exam: need to update  Pertinent Labs: Lab Results  Component Value Date   CHOL 139 11/28/2021   HDL 72 11/28/2021   LDLCALC 35 11/28/2021   LDLDIRECT 81.0 11/12/2015   TRIG 207 (H) 11/28/2021   CHOLHDL 1.9 11/28/2021   Lab Results  Component Value Date   NA 137 05/24/2022   K 5.4 (H) 05/24/2022   CREATININE 1.11 05/24/2022   EGFR 81 05/24/2022   LABMICR 43.4 02/21/2022   MICRALBCREAT 32 (H) 02/21/2022     ---------------------------------------------------------------------------------------------------   Medications: Outpatient Medications Prior to Visit  Medication Sig    amLODipine (NORVASC) 5 MG tablet Take 1 tablet (5 mg total) by mouth daily.   ammonium lactate (AMLACTIN) 12 % cream Apply topically as needed for dry skin.   atorvastatin (LIPITOR) 40 MG tablet Take 1 tablet (40 mg total) by mouth daily.   empagliflozin (JARDIANCE) 10 MG TABS tablet Take 1 tablet (10 mg total) by mouth daily before breakfast.   lisinopril (ZESTRIL) 5 MG tablet Take 1 tablet (5 mg total) by mouth daily.   meloxicam (MOBIC) 15 MG tablet Take 1 tablet by mouth daily.   metFORMIN (GLUCOPHAGE) 1000 MG tablet Take 1 tablet (1,000 mg total) by mouth 2 (two) times daily with a meal.   terbinafine (LAMISIL) 250 MG tablet Take 1 tablet (250 mg total) by mouth daily.   No facility-administered medications prior to visit.       Objective    Blood pressure (!) 132/90, pulse 99, weight 203 lb 9.6 oz (92.4 kg), SpO2 100 %.   Physical Exam Constitutional:      General: He is awake.     Appearance: He is well-developed.  HENT:     Head: Normocephalic.  Eyes:     Conjunctiva/sclera: Conjunctivae normal.  Cardiovascular:     Rate and Rhythm: Normal rate and regular rhythm.     Heart sounds: Normal heart sounds.  Pulmonary:     Effort: Pulmonary effort is normal.     Breath sounds: Normal breath sounds.  Skin:  General: Skin is warm.  Neurological:     Mental Status: He is alert and oriented to person, place, and time.  Psychiatric:        Attention and Perception: Attention normal.        Mood and Affect: Mood normal.        Speech: Speech normal.        Behavior: Behavior is cooperative.      No results found for any visits on 12/14/22.  Assessment & Plan     Problem List Items Addressed This Visit       Cardiovascular and Mediastinum   Hypertension associated with diabetes ([place])    Elevated in office and now slightly elevated at home too Increase amlodipine from 5 to 10 mg  F/u when back in town advised pt to continue to check and follow up virtually if needed  before next visit      Relevant Medications   amLODipine (NORVASC) 10 MG tablet     Endocrine   Type 2 diabetes mellitus with microalbuminuria, without long-term current use of insulin (HCC) - Primary    Last A1c 7.8% today 8% Managing with metformin 1000 mg BID and jardiance 10 mg-- pt had been without jardiance for months and only restarted last week. Continue at current dose. On statin on acei Foot exam utd uacr utd  Aware of needing optho F/u 3- mo      Relevant Orders   POCT HgB A1C     Other   Abnormal thyroid stimulating hormone level    Repeat for stability/improvement Pt is asymptomatic      Relevant Orders   TSH + free T4      Return in about 3 months (around 03/15/2023) for hypertension, DMII.      I, Mikey Kirschner, PA-C have reviewed all documentation for this visit. The documentation on  12/14/22  for the exam, diagnosis, procedures, and orders are all accurate and complete.  Mikey Kirschner, PA-C [facility] [address] [place], [place], [address] Office: [phone] Fax: [place]

## 2022-12-14 ENCOUNTER — Ambulatory Visit: Payer: BC Managed Care – PPO | Admitting: Physician Assistant

## 2022-12-14 ENCOUNTER — Encounter: Payer: Self-pay | Admitting: Physician Assistant

## 2022-12-14 VITALS — BP 132/90 | HR 99 | Wt 203.6 lb

## 2022-12-14 DIAGNOSIS — R809 Proteinuria, unspecified: Secondary | ICD-10-CM | POA: Diagnosis not present

## 2022-12-14 DIAGNOSIS — E1129 Type 2 diabetes mellitus with other diabetic kidney complication: Secondary | ICD-10-CM | POA: Diagnosis not present

## 2022-12-14 DIAGNOSIS — R7989 Other specified abnormal findings of blood chemistry: Secondary | ICD-10-CM | POA: Diagnosis not present

## 2022-12-14 DIAGNOSIS — Z23 Encounter for immunization: Secondary | ICD-10-CM | POA: Diagnosis not present

## 2022-12-14 DIAGNOSIS — I152 Hypertension secondary to endocrine disorders: Secondary | ICD-10-CM

## 2022-12-14 DIAGNOSIS — E1159 Type 2 diabetes mellitus with other circulatory complications: Secondary | ICD-10-CM

## 2022-12-14 LAB — POCT GLYCOSYLATED HEMOGLOBIN (HGB A1C): Hemoglobin A1C: 8 % — AB (ref 4.0–5.6)

## 2022-12-14 MED ORDER — AMLODIPINE BESYLATE 10 MG PO TABS: 10.0000 mg | ORAL_TABLET | Freq: Every day | ORAL | 1 refills | Status: DC

## 2022-12-14 NOTE — Assessment & Plan Note (Addendum)
Last A1c 7.8% today 8% Managing with metformin 1000 mg BID and jardiance 10 mg-- pt had been without jardiance for months and only restarted last week. Continue at current dose. On statin on acei Foot exam utd uacr utd  Aware of needing optho F/u 3- mo

## 2022-12-14 NOTE — Assessment & Plan Note (Addendum)
Elevated in office and now slightly elevated at home too Increase amlodipine from 5 to 10 mg  F/u when back in town advised pt to continue to check and follow up virtually if needed before next visit

## 2022-12-14 NOTE — Assessment & Plan Note (Signed)
Repeat for stability/improvement Pt is asymptomatic

## 2023-03-05 NOTE — Progress Notes (Deleted)
Established patient visit   Patient: Bradley Carroll   DOB: 03-09-1971   52 y.o. Male  MRN: [id number] Visit Date: 03/06/2023  Today's healthcare provider: [name], PA-C   No chief complaint on file.  Subjective    HPI  Hypertension, follow-up  BP Readings from Last 3 Encounters:  12/14/22 (!) 132/90  05/24/22 123/75  02/21/22 (!) 144/95   Wt Readings from Last 3 Encounters:  12/14/22 203 lb 9.6 oz (92.4 kg)  05/24/22 198 lb 14.4 oz (90.2 kg)  02/21/22 209 lb 4.8 oz (94.9 kg)     He was last seen for hypertension 3 months ago.  BP at that visit was 132/90. Management since that visit includes increase amlodipine from 5 to 10 mg .  He reports {excellent/good/fair/poor:19665} compliance with treatment. He {is/is not:9024} having side effects. {document side effects if present:1} He is following a {diet:21022986} diet. He {is/is not:9024} exercising. He {does/does not:200015} smoke.  Use of agents associated with hypertension: {bp agents assoc with hypertension:511::"none"}.   Outside blood pressures are {***enter patient reported home BP readings, or 'not being checked':1}. Symptoms: {Yes/No:20286} chest pain {Yes/No:20286} chest pressure  {Yes/No:20286} palpitations {Yes/No:20286} syncope  {Yes/No:20286} dyspnea {Yes/No:20286} orthopnea  {Yes/No:20286} paroxysmal nocturnal dyspnea {Yes/No:20286} lower extremity edema   Pertinent labs Lab Results  Component Value Date   CHOL 139 11/28/2021   HDL 72 11/28/2021   LDLCALC 35 11/28/2021   LDLDIRECT 81.0 11/12/2015   TRIG 207 (H) 11/28/2021   CHOLHDL 1.9 11/28/2021   Lab Results  Component Value Date   NA 137 05/24/2022   K 5.4 (H) 05/24/2022   CREATININE 1.11 05/24/2022   EGFR 81 05/24/2022   GLUCOSE 102 (H) 05/24/2022   TSH 0.298 (L) 05/24/2022     The 10-year ASCVD risk score (Arnett DK, et al., 2019) is:  8%  ---------------------------------------------------------------------------------------------------  Diabetes Mellitus Type II, Follow-up  Lab Results  Component Value Date   HGBA1C 8.0 (A) 12/14/2022   HGBA1C 7.8 (H) 05/24/2022   HGBA1C 7.4 (A) 02/21/2022   Wt Readings from Last 3 Encounters:  12/14/22 203 lb 9.6 oz (92.4 kg)  05/24/22 198 lb 14.4 oz (90.2 kg)  02/21/22 209 lb 4.8 oz (94.9 kg)   Last seen for diabetes 3 months ago.  Management since then includes managing with metformin 1000 mg BID and jardiance 10 mg-- pt had been without jardiance for months and only restarted last week. Continue at current dose. He reports {excellent/good/fair/poor:19665} compliance with treatment. He {is/is not:21021397} having side effects. {document side effects if present:1} Symptoms: {Yes/No:20286} fatigue {Yes/No:20286} foot ulcerations  {Yes/No:20286} appetite changes {Yes/No:20286} nausea  {Yes/No:20286} paresthesia of the feet  {Yes/No:20286} polydipsia  {Yes/No:20286} polyuria {Yes/No:20286} visual disturbances   {Yes/No:20286} vomiting     Home blood sugar records: {diabetes glucometry results:16657}  Episodes of hypoglycemia? {Yes/No:20286} {enter symptoms and frequency of symptoms if yes:1}   Current insulin regiment: {enter 'none' or type of insulin and number of units taken with each dose of each insulin formulation that the patient is taking:1} Most Recent Eye Exam: *** {Current exercise:16438:::1} {Current diet habits:16563:::1}  Pertinent Labs: Lab Results  Component Value Date   CHOL 139 11/28/2021   HDL 72 11/28/2021   LDLCALC 35 11/28/2021   LDLDIRECT 81.0 11/12/2015   TRIG 207 (H) 11/28/2021   CHOLHDL 1.9 11/28/2021   Lab Results  Component Value Date   NA 137 05/24/2022   K 5.4 (H) 05/24/2022   CREATININE 1.11 05/24/2022   EGFR  81 05/24/2022   LABMICR 43.4 02/21/2022   MICRALBCREAT 32 (H) 02/21/2022      ---------------------------------------------------------------------------------------------------   Medications: Outpatient Medications Prior to Visit  Medication Sig   amLODipine (NORVASC) 10 MG tablet Take 1 tablet (10 mg total) by mouth daily.   amLODipine (NORVASC) 5 MG tablet Take 1 tablet (5 mg total) by mouth daily.   ammonium lactate (AMLACTIN) 12 % cream Apply topically as needed for dry skin.   atorvastatin (LIPITOR) 40 MG tablet Take 1 tablet (40 mg total) by mouth daily.   empagliflozin (JARDIANCE) 10 MG TABS tablet Take 1 tablet (10 mg total) by mouth daily before breakfast.   lisinopril (ZESTRIL) 5 MG tablet Take 1 tablet (5 mg total) by mouth daily.   meloxicam (MOBIC) 15 MG tablet Take 1 tablet by mouth daily.   metFORMIN (GLUCOPHAGE) 1000 MG tablet Take 1 tablet (1,000 mg total) by mouth 2 (two) times daily with a meal.   terbinafine (LAMISIL) 250 MG tablet Take 1 tablet (250 mg total) by mouth daily.   No facility-administered medications prior to visit.    Review of Systems  {Labs  Heme  Chem  Endocrine  Serology  Results Review (optional):23779}   Objective    There were no vitals taken for this visit. {Show previous vital signs (optional):23777}  Physical Exam  ***  No results found for any visits on 03/06/23.  Assessment & Plan     ***  No follow-ups on file.      {provider attestation***:1}   [name], PA-C  [facility] [phone] (phone) [phone] (fax)  [facility]

## 2023-03-06 ENCOUNTER — Ambulatory Visit: Payer: BC Managed Care – PPO | Admitting: Physician Assistant

## 2023-03-08 ENCOUNTER — Other Ambulatory Visit: Payer: Self-pay | Admitting: Physician Assistant

## 2023-03-08 DIAGNOSIS — E1159 Type 2 diabetes mellitus with other circulatory complications: Secondary | ICD-10-CM

## 2023-03-09 ENCOUNTER — Encounter: Payer: Self-pay | Admitting: Physician Assistant

## 2023-03-09 NOTE — Telephone Encounter (Signed)
Spoke with patient.   Appointment scheduled.

## 2023-03-19 NOTE — Progress Notes (Deleted)
Established patient visit   Patient: Bradley Carroll   DOB: 30-May-1971   52 y.o. Male  MRN: [id number] Visit Date: 03/20/2023  Today's healthcare provider: [name], PA-C   No chief complaint on file.  Subjective    -Tetanus VAccine Knee Pain    Diabetes Mellitus Type II, Follow-up  Lab Results  Component Value Date   HGBA1C 8.0 (A) 12/14/2022   HGBA1C 7.8 (H) 05/24/2022   HGBA1C 7.4 (A) 02/21/2022   Wt Readings from Last 3 Encounters:  12/14/22 203 lb 9.6 oz (92.4 kg)  05/24/22 198 lb 14.4 oz (90.2 kg)  02/21/22 209 lb 4.8 oz (94.9 kg)   Last seen for diabetes 3 months ago.  Management since then includes continue at current dose. Marland Kitchen He reports {excellent/good/fair/poor:19665} compliance with treatment. He {is/is not:21021397} having side effects. {document side effects if present:1} Symptoms: {Yes/No:20286} fatigue {Yes/No:20286} foot ulcerations  {Yes/No:20286} appetite changes {Yes/No:20286} nausea  {Yes/No:20286} paresthesia of the feet  {Yes/No:20286} polydipsia  {Yes/No:20286} polyuria {Yes/No:20286} visual disturbances   {Yes/No:20286} vomiting     Home blood sugar records: {diabetes glucometry results:16657}  Episodes of hypoglycemia? {Yes/No:20286} {enter symptoms and frequency of symptoms if yes:1}   Current insulin regiment: {enter 'none' or type of insulin and number of units taken with each dose of each insulin formulation that the patient is taking:1} Most Recent Eye Exam: *** {Current exercise:16438:::1} {Current diet habits:16563:::1}  Pertinent Labs: Lab Results  Component Value Date   CHOL 139 11/28/2021   HDL 72 11/28/2021   LDLCALC 35 11/28/2021   LDLDIRECT 81.0 11/12/2015   TRIG 207 (H) 11/28/2021   CHOLHDL 1.9 11/28/2021   Lab Results  Component Value Date   NA 137 05/24/2022   K 5.4 (H) 05/24/2022   CREATININE 1.11 05/24/2022   EGFR 81 05/24/2022   LABMICR 43.4 02/21/2022   MICRALBCREAT 32 (H) 02/21/2022      ---------------------------------------------------------------------------------------------------  Hypertension, follow-up  BP Readings from Last 3 Encounters:  12/14/22 (!) 132/90  05/24/22 123/75  02/21/22 (!) 144/95   Wt Readings from Last 3 Encounters:  12/14/22 203 lb 9.6 oz (92.4 kg)  05/24/22 198 lb 14.4 oz (90.2 kg)  02/21/22 209 lb 4.8 oz (94.9 kg)     He was last seen for hypertension 3 months ago.  BP at that visit was 132/90. Management since that visit includes increase amlodipine from 5 to 10 mg .  He reports {excellent/good/fair/poor:19665} compliance with treatment. He {is/is not:9024} having side effects. {document side effects if present:1} He is following a {diet:21022986} diet. He {is/is not:9024} exercising. He {does/does not:200015} smoke.  Use of agents associated with hypertension: {bp agents assoc with hypertension:511::"none"}.   Outside blood pressures are {***enter patient reported home BP readings, or 'not being checked':1}. Symptoms: {Yes/No:20286} chest pain {Yes/No:20286} chest pressure  {Yes/No:20286} palpitations {Yes/No:20286} syncope  {Yes/No:20286} dyspnea {Yes/No:20286} orthopnea  {Yes/No:20286} paroxysmal nocturnal dyspnea {Yes/No:20286} lower extremity edema   Pertinent labs Lab Results  Component Value Date   CHOL 139 11/28/2021   HDL 72 11/28/2021   LDLCALC 35 11/28/2021   LDLDIRECT 81.0 11/12/2015   TRIG 207 (H) 11/28/2021   CHOLHDL 1.9 11/28/2021   Lab Results  Component Value Date   NA 137 05/24/2022   K 5.4 (H) 05/24/2022   CREATININE 1.11 05/24/2022   EGFR 81 05/24/2022   GLUCOSE 102 (H) 05/24/2022   TSH 0.298 (L) 05/24/2022     The 10-year ASCVD risk score (Arnett DK, et al., 2019) is: 8% ---------------------------------------------------------------------------------------------------  Medications: Outpatient Medications Prior to Visit  Medication Sig   amLODipine (NORVASC) 10 MG tablet Take 1 tablet  (10 mg total) by mouth daily.   amLODipine (NORVASC) 5 MG tablet Take 1 tablet (5 mg total) by mouth daily.   ammonium lactate (AMLACTIN) 12 % cream Apply topically as needed for dry skin.   atorvastatin (LIPITOR) 40 MG tablet Take 1 tablet (40 mg total) by mouth daily.   empagliflozin (JARDIANCE) 10 MG TABS tablet Take 1 tablet (10 mg total) by mouth daily before breakfast.   lisinopril (ZESTRIL) 5 MG tablet Take 1 tablet (5 mg total) by mouth daily.   meloxicam (MOBIC) 15 MG tablet Take 1 tablet by mouth daily.   metFORMIN (GLUCOPHAGE) 1000 MG tablet Take 1 tablet (1,000 mg total) by mouth 2 (two) times daily with a meal.   terbinafine (LAMISIL) 250 MG tablet Take 1 tablet (250 mg total) by mouth daily.   No facility-administered medications prior to visit.    Review of Systems  {Labs  Heme  Chem  Endocrine  Serology  Results Review (optional):23779}   Objective    There were no vitals taken for this visit. {Show previous vital signs (optional):23777}  Physical Exam  ***  No results found for any visits on 03/20/23.  Assessment & Plan     ***  No follow-ups on file.      {provider attestation***:1}   [name], PA-C  [facility] [phone] (phone) [phone] (fax)  [facility]

## 2023-03-20 ENCOUNTER — Ambulatory Visit: Payer: BC Managed Care – PPO | Admitting: Physician Assistant

## 2023-03-20 ENCOUNTER — Encounter: Payer: Self-pay | Admitting: Physician Assistant

## 2023-03-20 VITALS — BP 133/96 | HR 101 | Wt 206.1 lb

## 2023-03-20 DIAGNOSIS — E782 Mixed hyperlipidemia: Secondary | ICD-10-CM

## 2023-03-20 DIAGNOSIS — G8929 Other chronic pain: Secondary | ICD-10-CM

## 2023-03-20 DIAGNOSIS — R7989 Other specified abnormal findings of blood chemistry: Secondary | ICD-10-CM | POA: Diagnosis not present

## 2023-03-20 DIAGNOSIS — E1159 Type 2 diabetes mellitus with other circulatory complications: Secondary | ICD-10-CM

## 2023-03-20 DIAGNOSIS — I152 Hypertension secondary to endocrine disorders: Secondary | ICD-10-CM

## 2023-03-20 DIAGNOSIS — E1129 Type 2 diabetes mellitus with other diabetic kidney complication: Secondary | ICD-10-CM

## 2023-03-20 DIAGNOSIS — R809 Proteinuria, unspecified: Secondary | ICD-10-CM | POA: Diagnosis not present

## 2023-03-20 DIAGNOSIS — M25562 Pain in left knee: Secondary | ICD-10-CM

## 2023-03-20 LAB — POCT GLYCOSYLATED HEMOGLOBIN (HGB A1C): Hemoglobin A1C: 8 % — AB (ref 4.0–5.6)

## 2023-03-20 MED ORDER — LISINOPRIL 10 MG PO TABS
10.0000 mg | ORAL_TABLET | Freq: Every day | ORAL | 3 refills | Status: DC
Start: 2023-03-20 — End: 2024-06-12

## 2023-03-20 NOTE — Progress Notes (Unsigned)
I,Sha'taria Tyson,acting as a Neurosurgeon for [employer], PA-C.,have documented all relevant documentation on the behalf of [name], PA-C,as directed by  [name], PA-C while in the presence of [name], PA-C.   Established patient visit   Patient: Bradley Carroll   DOB: 1971/01/22   52 y.o. Male  MRN: [id number] Visit Date: 03/20/2023  Today's healthcare provider: [name], PA-C   No chief complaint on file.  Subjective    -Med indicates he can not eat the fruits he likes due to advising him not to consume   Knee Pain  The incident occurred more than 1 week ago (2 months ago). There was no injury mechanism. The pain is present in the left knee. Quality: in between a shooting and burning paining. The pain is at a severity of 6/10. The pain has been Intermittent since onset. He reports no foreign bodies present. Treatments tried: meloxicam. The treatment provided mild relief.    Hypertension, follow-up  BP Readings from Last 3 Encounters:  12/14/22 (!) 132/90  05/24/22 123/75  02/21/22 (!) 144/95   Wt Readings from Last 3 Encounters:  12/14/22 203 lb 9.6 oz (92.4 kg)  05/24/22 198 lb 14.4 oz (90.2 kg)  02/21/22 209 lb 4.8 oz (94.9 kg)     He was last seen for hypertension 3 months ago.  BP at that visit was 132/90. Management since that visit includes increase amlodipine from 5 to 10 mg .  Outside blood pressures are 130-134/90's Symptoms: No chest pain No chest pressure  No palpitations No syncope  No dyspnea No orthopnea  No paroxysmal nocturnal dyspnea No lower extremity edema   Pertinent labs Lab Results  Component Value Date   CHOL 139 11/28/2021   HDL 72 11/28/2021   LDLCALC 35 11/28/2021   LDLDIRECT 81.0 11/12/2015   TRIG 207 (H) 11/28/2021   CHOLHDL 1.9 11/28/2021   Lab Results  Component Value Date   NA 137 05/24/2022   K 5.4 (H) 05/24/2022   CREATININE 1.11 05/24/2022   EGFR 81 05/24/2022   GLUCOSE 102 (H) 05/24/2022   TSH  0.298 (L) 05/24/2022     The 10-year ASCVD risk score (Arnett DK, et al., 2019) is: 8%  ---------------------------------------------------------------------------------------------------  Diabetes Mellitus Type II, Follow-up  Lab Results  Component Value Date   HGBA1C 8.0 (A) 12/14/2022   HGBA1C 7.8 (H) 05/24/2022   HGBA1C 7.4 (A) 02/21/2022   Wt Readings from Last 3 Encounters:  12/14/22 203 lb 9.6 oz (92.4 kg)  05/24/22 198 lb 14.4 oz (90.2 kg)  02/21/22 209 lb 4.8 oz (94.9 kg)   Last seen for diabetes 3 months ago.  Management since then includes managing with metformin 1000 mg BID and jardiance 10 mg . Been without jardiance since end of Feb.   He reports excellent compliance with treatment. He is not having side effects.  Symptoms: No fatigue No foot ulcerations  No appetite changes No nausea  No paresthesia of the feet  No polydipsia  Yes polyuria No visual disturbances   No vomiting     Home blood sugar records:  not being checked  Episodes of hypoglycemia? No    Current insulin regiment: none Most Recent Eye Exam: 2023  Pertinent Labs: Lab Results  Component Value Date   CHOL 139 11/28/2021   HDL 72 11/28/2021   LDLCALC 35 11/28/2021   LDLDIRECT 81.0 11/12/2015   TRIG 207 (H) 11/28/2021   CHOLHDL 1.9 11/28/2021   Lab Results  Component Value Date   NA 137 05/24/2022   K 5.4 (H) 05/24/2022   CREATININE 1.11 05/24/2022   EGFR 81 05/24/2022   LABMICR 43.4 02/21/2022   MICRALBCREAT 32 (H) 02/21/2022    ---------------------------------------------------------------------------------------------------    Medications: Outpatient Medications Prior to Visit  Medication Sig   amLODipine (NORVASC) 10 MG tablet Take 1 tablet (10 mg total) by mouth daily.   amLODipine (NORVASC) 5 MG tablet Take 1 tablet (5 mg total) by mouth daily.   ammonium lactate (AMLACTIN) 12 % cream Apply topically as needed for dry skin.   atorvastatin (LIPITOR) 40 MG tablet Take  1 tablet (40 mg total) by mouth daily.   empagliflozin (JARDIANCE) 10 MG TABS tablet Take 1 tablet (10 mg total) by mouth daily before breakfast.   lisinopril (ZESTRIL) 5 MG tablet Take 1 tablet (5 mg total) by mouth daily.   meloxicam (MOBIC) 15 MG tablet Take 1 tablet by mouth daily.   metFORMIN (GLUCOPHAGE) 1000 MG tablet Take 1 tablet (1,000 mg total) by mouth 2 (two) times daily with a meal.   terbinafine (LAMISIL) 250 MG tablet Take 1 tablet (250 mg total) by mouth daily.   No facility-administered medications prior to visit.    Review of Systems    Objective    There were no vitals taken for this visit.  Physical Exam  ***  No results found for any visits on 03/20/23.  Assessment & Plan     ***  No follow-ups on file.      I, [name], PA-C have reviewed all documentation for this visit. The documentation on  03/20/23   for the exam, diagnosis, procedures, and orders are all accurate and complete.  [name], PA-C [facility] [address] [place], [place], [address] Office: [phone] Fax: [phone]    [facility]

## 2023-03-21 ENCOUNTER — Encounter: Payer: Self-pay | Admitting: Physician Assistant

## 2023-03-21 MED ORDER — EMPAGLIFLOZIN 25 MG PO TABS
25.0000 mg | ORAL_TABLET | Freq: Every day | ORAL | 1 refills | Status: DC
Start: 2023-03-21 — End: 2024-06-12

## 2023-03-21 NOTE — Assessment & Plan Note (Signed)
Still elevated in office. Continue amlodipine 10 mg increase lisinopril from 5 to 10 mg Monitor bp at home

## 2023-03-21 NOTE — Assessment & Plan Note (Signed)
A1c stable 8% Pt has been [place]ardiance [place] Given samples of 10 mg doses x 2 weeks and then increase to 25 mg dose.  Continue other medications  Ordered uacr On statin, acei F/u 3 mo

## 2023-03-21 NOTE — Assessment & Plan Note (Signed)
Repeat tsh/t4. If still abnormal ref to endo

## 2023-03-23 ENCOUNTER — Other Ambulatory Visit: Payer: Self-pay | Admitting: Physician Assistant

## 2023-03-23 DIAGNOSIS — R809 Proteinuria, unspecified: Secondary | ICD-10-CM | POA: Diagnosis not present

## 2023-03-23 DIAGNOSIS — E782 Mixed hyperlipidemia: Secondary | ICD-10-CM | POA: Diagnosis not present

## 2023-03-23 DIAGNOSIS — E1129 Type 2 diabetes mellitus with other diabetic kidney complication: Secondary | ICD-10-CM | POA: Diagnosis not present

## 2023-03-23 DIAGNOSIS — R7989 Other specified abnormal findings of blood chemistry: Secondary | ICD-10-CM | POA: Diagnosis not present

## 2023-03-23 DIAGNOSIS — L299 Pruritus, unspecified: Secondary | ICD-10-CM

## 2023-03-23 NOTE — Telephone Encounter (Signed)
Requested Prescriptions  Pending Prescriptions Disp Refills   ammonium lactate (AMLACTIN) 12 % cream [Pharmacy Med Name: AMMONIUM LACTATE CRM 140GM 12%] 280 g 1    Sig: APPLY TOPICALLY AS NEEDED FOR DRY SKIN     Dermatology:  Other Passed - 03/23/2023  6:51 AM      Passed - Valid encounter within last 12 months    Recent Outpatient Visits           3 days ago Type 2 diabetes mellitus with microalbuminuria, without long-term current use of insulin (HCC)   [place] [facility] [place], [place], PA-C   3 months ago Type 2 diabetes mellitus with microalbuminuria, without long-term current use of insulin [facility])   [place] [facility] [name], PA-C   10 months ago Annual physical exam   [facility] [name], PA-C   1 year ago Type 2 diabetes mellitus with microalbuminuria, without long-term current use of insulin [facility])   [place] [facility] [name], PA-C   1 year ago Type 2 diabetes mellitus with microalbuminuria, without long-term current use of insulin [facility])   [facility] [name], [place]

## 2023-03-24 LAB — COMPREHENSIVE METABOLIC PANEL
ALT: 24 IU/L (ref 0–44)
AST: 26 IU/L (ref 0–40)
Albumin/Globulin Ratio: 2.2 (ref 1.2–2.2)
Albumin: 4.9 g/dL (ref 4.1–5.1)
Alkaline Phosphatase: 65 IU/L (ref 44–121)
BUN/Creatinine Ratio: 17 (ref 9–20)
BUN: 18 mg/dL (ref 6–24)
Bilirubin Total: 1.3 mg/dL — ABNORMAL HIGH (ref 0.0–1.2)
CO2: 22 mmol/L (ref 20–29)
Calcium: 10 mg/dL (ref 8.7–10.2)
Chloride: 102 mmol/L (ref 96–106)
Creatinine, Ser: 1.04 mg/dL (ref 0.76–1.27)
Globulin, Total: 2.2 g/dL (ref 1.5–4.5)
Glucose: 117 mg/dL — ABNORMAL HIGH (ref 70–99)
Potassium: 5.1 mmol/L (ref 3.5–5.2)
Sodium: 142 mmol/L (ref 134–144)
Total Protein: 7.1 g/dL (ref 6.0–8.5)
eGFR: 87 mL/min/{1.73_m2} (ref >59)

## 2023-03-24 LAB — LIPID PANEL
Chol/HDL Ratio: 2.3 ratio (ref 0.0–5.0)
Cholesterol, Total: 180 mg/dL (ref 100–199)
HDL: 79 mg/dL (ref >39)
LDL Chol Calc (NIH): 79 mg/dL (ref 0–99)
Triglycerides: 130 mg/dL (ref 0–149)
VLDL Cholesterol Cal: 22 mg/dL (ref 5–40)

## 2023-03-24 LAB — MICROALBUMIN / CREATININE URINE RATIO
Creatinine, Urine: 75.4 mg/dL
Microalb/Creat Ratio: 32 mg/g creat — ABNORMAL HIGH (ref 0–29)
Microalbumin, Urine: 24.1 ug/mL

## 2023-03-24 LAB — CBC WITH DIFFERENTIAL/PLATELET
Basophils Absolute: 0.1 10*3/uL (ref 0.0–0.2)
Basos: 1 %
EOS (ABSOLUTE): 0.1 10*3/uL (ref 0.0–0.4)
Eos: 2 %
Hematocrit: 47.9 % (ref 37.5–51.0)
Hemoglobin: 16.3 g/dL (ref 13.0–17.7)
Immature Grans (Abs): 0 10*3/uL (ref 0.0–0.1)
Immature Granulocytes: 1 %
Lymphocytes Absolute: 1 10*3/uL (ref 0.7–3.1)
Lymphs: 22 %
MCH: 30.9 pg (ref 26.6–33.0)
MCHC: 34 g/dL (ref 31.5–35.7)
MCV: 91 fL (ref 79–97)
Monocytes Absolute: 0.4 10*3/uL (ref 0.1–0.9)
Monocytes: 9 %
Neutrophils Absolute: 3 10*3/uL (ref 1.4–7.0)
Neutrophils: 65 %
Platelets: 212 10*3/uL (ref 150–450)
RBC: 5.28 x10E6/uL (ref 4.14–5.80)
RDW: 12.8 % (ref 11.6–15.4)
WBC: 4.6 10*3/uL (ref 3.4–10.8)

## 2023-03-24 LAB — TSH+FREE T4
Free T4: 1.33 ng/dL (ref 0.82–1.77)
TSH: 0.222 u[IU]/mL — ABNORMAL LOW (ref 0.450–4.500)

## 2023-03-26 ENCOUNTER — Other Ambulatory Visit: Payer: Self-pay | Admitting: Physician Assistant

## 2023-03-26 DIAGNOSIS — E0590 Thyrotoxicosis, unspecified without thyrotoxic crisis or storm: Secondary | ICD-10-CM

## 2023-03-28 DIAGNOSIS — M170 Bilateral primary osteoarthritis of knee: Secondary | ICD-10-CM | POA: Diagnosis not present

## 2023-05-22 ENCOUNTER — Other Ambulatory Visit: Payer: Managed Care, Other (non HMO)

## 2023-05-22 ENCOUNTER — Other Ambulatory Visit: Payer: Self-pay

## 2023-05-22 ENCOUNTER — Other Ambulatory Visit (INDEPENDENT_AMBULATORY_CARE_PROVIDER_SITE_OTHER): Payer: Self-pay

## 2023-05-22 DIAGNOSIS — E0590 Thyrotoxicosis, unspecified without thyrotoxic crisis or storm: Secondary | ICD-10-CM

## 2023-05-22 LAB — TSH: TSH: 0.3 u[IU]/mL — ABNORMAL LOW (ref 0.35–5.50)

## 2023-05-22 LAB — T4, FREE: Free T4: 0.81 ng/dL (ref 0.60–1.60)

## 2023-05-22 LAB — T3, FREE: T3, Free: 3.4 pg/mL (ref 2.3–4.2)

## 2023-05-25 LAB — TRAB (TSH RECEPTOR BINDING ANTIBODY): TRAB: <1 IU/L (ref <=2.00)

## 2023-05-28 LAB — THYROID STIMULATING IMMUNOGLOBULIN: TSI: <89 % baseline (ref <140)

## 2023-05-29 ENCOUNTER — Encounter: Payer: Self-pay | Admitting: "Endocrinology

## 2023-05-29 ENCOUNTER — Ambulatory Visit: Payer: BC Managed Care – PPO | Admitting: "Endocrinology

## 2023-05-29 VITALS — BP 125/90 | HR 106 | Ht 70.0 in | Wt 199.8 lb

## 2023-05-29 DIAGNOSIS — E0590 Thyrotoxicosis, unspecified without thyrotoxic crisis or storm: Secondary | ICD-10-CM

## 2023-05-29 NOTE — Progress Notes (Signed)
Outpatient Endocrinology Note Bradley Persia, MD  05/29/23   Bradley Carroll 12/13/1970 [id number]  Referring Provider: [name], PA-C Primary Care Provider: [name], PA-C Subjective  Chief Complaint  Patient presents with   Hyperthyroidism    Assessment & Plan  Bradley Carroll was seen today for hyperthyroidism.  Diagnoses and all orders for this visit:  Subclinical hyperthyroidism -     TSH Rfx on Abnormal to Free T4; Future   Bradley Carroll is currently not currently taking any thyroid medication. Patient has low normal TSH at baseline, and is not representative of subclinical hyperthyroidism. TSI/TRAb WNL. Patient is currently clinically and biochemically euthyroid.  Educated patient on symptoms of  hyperthyroidism.  Educated on thyroid axis.  Recommend the following: TSH annually/earlier if symptoms of hyperthyroidism develop.   I have reviewed current medications, nurse's notes, allergies, vital signs, past medical and surgical history, family medical history, and social history for this encounter. Counseled patient on symptoms, examination findings, lab findings, imaging results, treatment decisions and monitoring and prognosis. The patient understood the recommendations and agrees with the treatment plan. All questions regarding treatment plan were fully answered.   Return in about 1 year (around 05/28/2024) for visit + labs before next visit.   Bradley Crawford, MD  05/29/23   I have reviewed current medications, nurse's notes, allergies, vital signs, past medical and surgical history, family medical history, and social history for this encounter. Counseled patient on symptoms, examination findings, lab findings, imaging results, treatment decisions and monitoring and prognosis. The patient understood the recommendations and agrees with the treatment plan. All questions regarding treatment plan were fully answered.   History of Present Illness Bradley Carroll  is a 52 y.o. year old male who presents to our clinic with chronically low TSH.   Symptoms suggestive of HYPERTHYROIDISM:  weight loss  No heat intolerance No hyperdefecation  No palpitations  No  Compressive symptoms:  dysphagia  No dysphonia  No positional dyspnea (especially with simultaneous arms elevation)  No  Physical Exam  BP (!) 125/90   Pulse (!) 106   Ht 5\' 10"  (1.778 m)   Wt 199 lb 12.8 oz (90.6 kg)   SpO2 98%   BMI 28.67 kg/m  Constitutional: well developed, well nourished Head: normocephalic, atraumatic, no exophthalmos Eyes: sclera anicteric, no redness Neck: no thyromegaly, no thyroid tenderness; no nodules palpated Lungs: normal respiratory effort Neurology: alert and oriented, no fine hand tremor Skin: dry, no appreciable rashes Musculoskeletal: no appreciable defects Psychiatric: normal mood and affect  Allergies Allergies  Allergen Reactions   Other Other (See Comments)    Shellfish-upset stomach.   Shellfish Allergy Nausea And Vomiting    Current Medications Patient's Medications  New Prescriptions   No medications on file  Previous Medications   AMLODIPINE (NORVASC) 10 MG TABLET    Take 1 tablet (10 mg total) by mouth daily.   AMMONIUM LACTATE (AMLACTIN) 12 % CREAM    APPLY TOPICALLY AS NEEDED FOR DRY SKIN   ATORVASTATIN (LIPITOR) 40 MG TABLET    Take 1 tablet (40 mg total) by mouth daily.   EMPAGLIFLOZIN (JARDIANCE) 25 MG TABS TABLET    Take 1 tablet (25 mg total) by mouth daily before breakfast.   LISINOPRIL (ZESTRIL) 10 MG TABLET    Take 1 tablet (10 mg total) by mouth daily.   MELOXICAM (MOBIC) 15 MG TABLET    Take 1 tablet by mouth daily.   METFORMIN (GLUCOPHAGE) 1000 MG TABLET    Take  1 tablet (1,000 mg total) by mouth 2 (two) times daily with a meal.   TERBINAFINE (LAMISIL) 250 MG TABLET    Take 1 tablet (250 mg total) by mouth daily.  Modified Medications   No medications on file  Discontinued Medications   AMLODIPINE (NORVASC) 5  MG TABLET    Take 1 tablet (5 mg total) by mouth daily.    Past Medical History Past Medical History:  Diagnosis Date   Allergy    Asthma    Osteoarthritis    knees   Pre-diabetes     Past Surgical History Past Surgical History:  Procedure Laterality Date   COLONOSCOPY WITH PROPOFOL N/A 02/16/2020   Procedure: COLONOSCOPY WITH PROPOFOL;  Surgeon: [name], MD;  Location: [facility];  Service: Endoscopy;  Laterality: N/A;   KNEE ARTHROSCOPY W/ PARTIAL MEDIAL MENISCECTOMY  2005    Family History family history includes Congestive Heart Failure in his father; Diabetes in his father, mother, sister, and sister; Hypertension in his father.  Social History Social History   Socioeconomic History   Marital status: Married    Spouse name: Not on file   Number of children: Not on file   Years of education: Not on file   Highest education level: Not on file  Occupational History   Not on file  Tobacco Use   Smoking status: Former    Packs/day: 0.10    Years: 4.00    Additional pack years: 0.00    Total pack years: 0.40    Types: Cigarettes   Smokeless tobacco: Former  Building services engineer Use: Never used  Substance and Sexual Activity   Alcohol use: Yes    Alcohol/week: 10.0 standard drinks of alcohol    Types: 10 Cans of beer per week   Drug use: No   Sexual activity: Not on file  Other Topics Concern   Not on file  Social History Narrative   Lives in [place] with wife and 2 kids.  Works as Acupuncturist.  Likes golf, basketball   Social Determinants of Health   Financial Resource Strain: Not on file  Food Insecurity: Not on file  Transportation Needs: Not on file  Physical Activity: Not on file  Stress: Not on file  Social Connections: Not on file  Intimate Partner Violence: Not on file    Laboratory Investigations Lab Results  Component Value Date   TSH 0.30 (L) 05/22/2023   TSH 0.222 (L) 03/23/2023   TSH 0.298 (L) 05/24/2022   FREET4  0.81 05/22/2023   FREET4 1.33 03/23/2023   FREET4 1.21 05/24/2022     Lab Results  Component Value Date   TSI <89 05/22/2023     Component     Latest Ref Rng 05/22/2023  TRAB     <=2.00 IU/L <1.00    Lab Results  Component Value Date   CHOL 180 03/23/2023   Lab Results  Component Value Date   HDL 79 03/23/2023   Lab Results  Component Value Date   LDLCALC 79 03/23/2023   Lab Results  Component Value Date   TRIG 130 03/23/2023   Lab Results  Component Value Date   CHOLHDL 2.3 03/23/2023   Lab Results  Component Value Date   CREATININE 1.04 03/23/2023   Lab Results  Component Value Date   GFR 87.65 11/12/2015       Component Value Date/Time   WBC 4.6 03/23/2023 0802   WBC 4.8 11/12/2015 1523   RBC  5.28 03/23/2023 0802   RBC 5.36 11/12/2015 1523   HGB 16.3 03/23/2023 0802   HCT 47.9 03/23/2023 0802   PLT 212 03/23/2023 0802   MCV 91 03/23/2023 0802   MCH 30.9 03/23/2023 0802   MCHC 34.0 03/23/2023 0802   MCHC 33.5 11/12/2015 1523   RDW 12.8 03/23/2023 0802   LYMPHSABS 1.0 03/23/2023 0802   MONOABS 0.3 01/15/2013 0815   EOSABS 0.1 03/23/2023 0802   BASOSABS 0.1 03/23/2023 0802      Parts of this note may have been dictated using voice recognition software. There may be variances in spelling and vocabulary which are unintentional. Not all errors are proofread. Please notify the Thereasa Parkin if any discrepancies are noted or if the meaning of any statement is not clear.

## 2023-06-06 ENCOUNTER — Encounter: Payer: Self-pay | Admitting: Physician Assistant

## 2023-06-06 ENCOUNTER — Encounter: Payer: Self-pay | Admitting: Podiatry

## 2023-07-03 NOTE — Progress Notes (Unsigned)
      Established patient visit   Patient: Bradley Carroll   DOB: August 26, 1971   52 y.o. Male  MRN: [id number] Visit Date: 07/04/2023  Today's healthcare provider: Ronnald Ramp, MD   No chief complaint on file.  Subjective       Discussed the use of AI scribe software for clinical note transcription with the patient, who gave verbal consent to proceed.  History of Present Illness             Medications: Outpatient Medications Prior to Visit  Medication Sig   amLODipine (NORVASC) 10 MG tablet Take 1 tablet (10 mg total) by mouth daily.   ammonium lactate (AMLACTIN) 12 % cream APPLY TOPICALLY AS NEEDED FOR DRY SKIN   atorvastatin (LIPITOR) 40 MG tablet Take 1 tablet (40 mg total) by mouth daily.   empagliflozin (JARDIANCE) 25 MG TABS tablet Take 1 tablet (25 mg total) by mouth daily before breakfast.   lisinopril (ZESTRIL) 10 MG tablet Take 1 tablet (10 mg total) by mouth daily.   meloxicam (MOBIC) 15 MG tablet Take 1 tablet by mouth daily.   metFORMIN (GLUCOPHAGE) 1000 MG tablet Take 1 tablet (1,000 mg total) by mouth 2 (two) times daily with a meal.   terbinafine (LAMISIL) 250 MG tablet Take 1 tablet (250 mg total) by mouth daily. (Patient not taking: Reported on 05/29/2023)   No facility-administered medications prior to visit.    Review of Systems  {Insert previous labs (optional):23779} {See past labs  Heme  Chem  Endocrine  Serology  Results Review (optional):1}   Objective    There were no vitals taken for this visit. {Insert last BP/Wt (optional):23777}{See vitals history (optional):1}   Physical Exam  ***  No results found for any visits on 07/04/23.  Assessment & Plan     Problem List Items Addressed This Visit   None   Assessment and Plan              No follow-ups on file.         Ronnald Ramp, MD  [facility] [phone] (phone) [phone] (fax)  [facility]

## 2023-07-04 ENCOUNTER — Ambulatory Visit: Payer: BC Managed Care – PPO | Admitting: Family Medicine

## 2023-07-04 ENCOUNTER — Encounter: Payer: Self-pay | Admitting: Family Medicine

## 2023-07-04 VITALS — BP 130/99 | HR 88 | Ht 70.0 in | Wt 209.0 lb

## 2023-07-04 DIAGNOSIS — I152 Hypertension secondary to endocrine disorders: Secondary | ICD-10-CM

## 2023-07-04 DIAGNOSIS — R809 Proteinuria, unspecified: Secondary | ICD-10-CM

## 2023-07-04 DIAGNOSIS — E1159 Type 2 diabetes mellitus with other circulatory complications: Secondary | ICD-10-CM | POA: Diagnosis not present

## 2023-07-04 DIAGNOSIS — E1129 Type 2 diabetes mellitus with other diabetic kidney complication: Secondary | ICD-10-CM | POA: Diagnosis not present

## 2023-07-04 DIAGNOSIS — Q800 Ichthyosis vulgaris: Secondary | ICD-10-CM

## 2023-07-04 MED ORDER — AMMONIUM LACTATE 12 % EX CREA
TOPICAL_CREAM | CUTANEOUS | 3 refills | Status: DC | PRN
Start: 2023-07-04 — End: 2024-04-03

## 2023-07-04 NOTE — Assessment & Plan Note (Signed)
Last A1C in April was 8. Currently on Metformin 1000mg  twice daily and Jardiance 10mg  daily (reduced from 20mg  due to symptoms suggestive of dehydration). Patient open to considering once-weekly injectable medications (Ozempic, Mounjaro, Trulicity) if A1C is not controlled. -Check A1C today. -If A1C is elevated, consider adding once-weekly injectable medication. -Continue Metformin 1000mg  twice daily and Jardiance 10mg  daily.

## 2023-07-04 NOTE — Assessment & Plan Note (Signed)
On Amlodipine 10mg  and Lisinopril 10mg  daily. Recent home readings around 130/85. Patient has not taken medication for the past two days. -Continue Amlodipine 10mg  and Lisinopril 10mg  daily. -Advise patient to monitor blood pressure at home one hour after taking medication for the next couple of weeks. -Schedule follow-up appointment in one month to reassess blood pressure control.

## 2023-07-04 NOTE — Assessment & Plan Note (Signed)
Chronic dry, scaly skin. Family history of similar condition. Responds well to prescribed cream (amylactin lotion). Patient open to alternative treatments if less expensive and equally effective. -Continue amylactin lotion as needed. -Consider referral to dermatology for potential alternative treatments.

## 2023-07-05 ENCOUNTER — Other Ambulatory Visit: Payer: Self-pay | Admitting: Family Medicine

## 2023-07-05 DIAGNOSIS — E1129 Type 2 diabetes mellitus with other diabetic kidney complication: Secondary | ICD-10-CM

## 2023-07-05 LAB — COMPREHENSIVE METABOLIC PANEL
ALT: 32 IU/L (ref 0–44)
AST: 21 IU/L (ref 0–40)
Albumin: 4.7 g/dL (ref 4.1–5.1)
Alkaline Phosphatase: 78 IU/L (ref 44–121)
BUN/Creatinine Ratio: 10 (ref 9–20)
BUN: 10 mg/dL (ref 6–24)
Bilirubin Total: 0.9 mg/dL (ref 0.0–1.2)
CO2: 24 mmol/L (ref 20–29)
Calcium: 9.7 mg/dL (ref 8.7–10.2)
Chloride: 102 mmol/L (ref 96–106)
Creatinine, Ser: 1 mg/dL (ref 0.76–1.27)
Globulin, Total: 2.3 g/dL (ref 1.5–4.5)
Glucose: 212 mg/dL — ABNORMAL HIGH (ref 70–99)
Potassium: 5.2 mmol/L (ref 3.5–5.2)
Sodium: 139 mmol/L (ref 134–144)
Total Protein: 7 g/dL (ref 6.0–8.5)
eGFR: 92 mL/min/{1.73_m2} (ref >59)

## 2023-07-05 LAB — HEMOGLOBIN A1C
Est. average glucose Bld gHb Est-mCnc: 200 mg/dL
Hgb A1c MFr Bld: 8.6 % — ABNORMAL HIGH (ref 4.8–5.6)

## 2023-07-05 MED ORDER — OZEMPIC (0.25 OR 0.5 MG/DOSE) 2 MG/3ML ~~LOC~~ SOPN
0.2500 mg | PEN_INJECTOR | SUBCUTANEOUS | 0 refills | Status: DC
Start: 2023-07-05 — End: 2024-06-12

## 2023-07-05 MED ORDER — BD PEN NEEDLE NANO 2ND GEN 32G X 4 MM MISC: 1.0000 | 3 refills | Status: DC

## 2023-07-16 ENCOUNTER — Encounter: Payer: Self-pay | Admitting: Family Medicine

## 2023-08-03 ENCOUNTER — Encounter: Payer: Self-pay | Admitting: Pharmacist

## 2023-08-08 ENCOUNTER — Ambulatory Visit: Payer: BC Managed Care – PPO | Admitting: Family Medicine

## 2023-08-27 ENCOUNTER — Other Ambulatory Visit: Payer: Self-pay | Admitting: Physician Assistant

## 2023-08-28 NOTE — Telephone Encounter (Signed)
Requested Prescriptions  Pending Prescriptions Disp Refills   JARDIANCE 10 MG TABS tablet [Pharmacy Med Name: JARDIANCE TABS 10MG ] 90 tablet 3    Sig: TAKE 1 TABLET DAILY BEFORE BREAKFAST     There is no refill protocol information for this order

## 2023-11-12 ENCOUNTER — Encounter: Payer: Self-pay | Admitting: Family Medicine

## 2023-11-12 NOTE — Telephone Encounter (Signed)
 Care team updated and letter sent for eye exam notes.

## 2023-12-13 DIAGNOSIS — M170 Bilateral primary osteoarthritis of knee: Secondary | ICD-10-CM | POA: Diagnosis not present

## 2024-01-11 ENCOUNTER — Other Ambulatory Visit: Payer: Self-pay | Admitting: Physician Assistant

## 2024-01-11 DIAGNOSIS — E1129 Type 2 diabetes mellitus with other diabetic kidney complication: Secondary | ICD-10-CM

## 2024-01-11 DIAGNOSIS — E782 Mixed hyperlipidemia: Secondary | ICD-10-CM

## 2024-01-22 ENCOUNTER — Other Ambulatory Visit: Payer: Self-pay | Admitting: Family Medicine

## 2024-04-02 ENCOUNTER — Other Ambulatory Visit: Payer: Self-pay | Admitting: Family Medicine

## 2024-04-02 DIAGNOSIS — Q800 Ichthyosis vulgaris: Secondary | ICD-10-CM

## 2024-04-11 DIAGNOSIS — M170 Bilateral primary osteoarthritis of knee: Secondary | ICD-10-CM | POA: Diagnosis not present

## 2024-05-13 ENCOUNTER — Other Ambulatory Visit: Payer: Self-pay | Admitting: Family Medicine

## 2024-05-13 DIAGNOSIS — Q800 Ichthyosis vulgaris: Secondary | ICD-10-CM

## 2024-05-28 ENCOUNTER — Ambulatory Visit: Payer: BC Managed Care – PPO | Admitting: "Endocrinology

## 2024-06-06 ENCOUNTER — Other Ambulatory Visit: Payer: Self-pay | Admitting: Physician Assistant

## 2024-06-06 DIAGNOSIS — I152 Hypertension secondary to endocrine disorders: Secondary | ICD-10-CM

## 2024-06-11 ENCOUNTER — Encounter: Payer: Self-pay | Admitting: Family Medicine

## 2024-06-12 ENCOUNTER — Telehealth: Admitting: Family Medicine

## 2024-06-12 ENCOUNTER — Encounter: Payer: Self-pay | Admitting: Family Medicine

## 2024-06-12 VITALS — BP 118/78 | Wt 194.0 lb

## 2024-06-12 DIAGNOSIS — E1159 Type 2 diabetes mellitus with other circulatory complications: Secondary | ICD-10-CM | POA: Diagnosis not present

## 2024-06-12 DIAGNOSIS — E1169 Type 2 diabetes mellitus with other specified complication: Secondary | ICD-10-CM

## 2024-06-12 DIAGNOSIS — Q800 Ichthyosis vulgaris: Secondary | ICD-10-CM | POA: Diagnosis not present

## 2024-06-12 DIAGNOSIS — I152 Hypertension secondary to endocrine disorders: Secondary | ICD-10-CM

## 2024-06-12 DIAGNOSIS — M170 Bilateral primary osteoarthritis of knee: Secondary | ICD-10-CM

## 2024-06-12 DIAGNOSIS — R809 Proteinuria, unspecified: Secondary | ICD-10-CM

## 2024-06-12 DIAGNOSIS — E1129 Type 2 diabetes mellitus with other diabetic kidney complication: Secondary | ICD-10-CM

## 2024-06-12 DIAGNOSIS — Z7984 Long term (current) use of oral hypoglycemic drugs: Secondary | ICD-10-CM

## 2024-06-12 DIAGNOSIS — E785 Hyperlipidemia, unspecified: Secondary | ICD-10-CM

## 2024-06-12 MED ORDER — AMLODIPINE BESYLATE 10 MG PO TABS: 10.0000 mg | ORAL_TABLET | Freq: Every day | ORAL | 3 refills | Status: AC

## 2024-06-12 MED ORDER — LISINOPRIL 10 MG PO TABS: 10.0000 mg | ORAL_TABLET | Freq: Every day | ORAL | 3 refills | Status: AC

## 2024-06-12 NOTE — Assessment & Plan Note (Signed)
 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 Currently on metformin . Unable to tolerate jardiance  due to dehydration, unable to tolerate semaglutide  due to GI upset in Aug 2024. Discontinued Ozempic  due to gastrointestinal side effects, which were impractical for his work environment. - Continue metformin  1000 mg twice daily - Discontinue Ozempic  and jardiance  due to intolerances listed above - Order HbA1c and urine albumin test

## 2024-06-12 NOTE — Assessment & Plan Note (Signed)
 Osteoarthritis of the Knees Severe osteoarthritis with no cartilage in knees, requiring future knee replacement surgeries. Currently managed with meloxicam . - Continue meloxicam  as prescribed by orthopedic specialist

## 2024-06-12 NOTE — Assessment & Plan Note (Signed)
  Chronic hypertension appears to be well controlled based on reported BP. Last recorded blood pressure was 118/78 mmHg, but home readings are inconsistent. Currently on amlodipine  10 mg and lisinopril  10 mg. - Continue amlodipine  10 mg daily - Continue lisinopril  10 mg daily - Order basic metabolic panel

## 2024-06-12 NOTE — Assessment & Plan Note (Signed)
 Chronic hyperlipidemia, currently managed with atorvastatin  40 mg. Requires updated lipid panel to assess current control. - Continue atorvastatin  40 mg daily - Order updated lipid panel

## 2024-06-12 NOTE — Progress Notes (Signed)
 MyChart Video Visit    Virtual Visit via Video Note   This format is felt to be most appropriate for this patient at this time. Physical exam was limited by quality of the video and audio technology used for the visit.   Patient location: Patient's home address   Provider location: [facility]  [address]  [place], [place] [address]   I discussed the limitations of evaluation and management by telemedicine and the availability of in person appointments. The patient expressed understanding and agreed to proceed.  Patient: Bradley Carroll   DOB: 12/28/1970   53 y.o. Male  MRN: [id number] Visit Date: 06/12/2024  Today's healthcare provider: Rockie Agent, MD   No chief complaint on file.  Subjective    HPI   Discussed the use of AI scribe software for clinical note transcription with the patient, who gave verbal consent to proceed.  History of Present Illness Bradley Carroll is a 53 year old male who presents for medication management.  He has a history of hypertension, type two diabetes with albuminuria, asthma, hyperlipidemia, and ectodermal dysplasia vulgaris. His blood pressure readings have not been within the goal range recently, and his last A1c was 8.6.  He is currently taking amlodipine  10 mg for hypertension, lisinopril  10 mg for both hypertension and diabetes, and metformin  1000 mg twice a day for diabetes management. He also takes atorvastatin  40 mg for hyperlipidemia. 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  For his skin condition, ectodermal dysplasia vulgaris, he uses ammonium lactate  12% cream, which he applies more frequently in the winter due to dry air. He has four refills available for this medication.  He reports a recent weight of 194 pounds and checks his blood pressure periodically, with a recent  reading of 118/78 taken at a doctor's visit three weeks ago. He mentions that his blood pressure readings can vary depending on the accuracy of his home machine.  He is also taking meloxicam  for knee pain, prescribed by [facility], and may need knee replacement surgery in the future due to lack of cartilage in his knees.      Past Medical History:  Diagnosis Date   Allergy    Asthma    Osteoarthritis    knees   Pre-diabetes     Medications: Outpatient Medications Prior to Visit  Medication Sig   ammonium lactate  (AMLACTIN) 12 % cream APPLY TOPICALLY AS NEEDED FOR DRY SKIN   atorvastatin  (LIPITOR) 40 MG tablet TAKE 1 TABLET DAILY   Insulin Pen Needle (BD PEN NEEDLE NANO 2ND GEN) 32G X 4 MM MISC 1 each by Does not apply route once a week.   meloxicam  (MOBIC ) 15 MG tablet Take 1 tablet by mouth daily.   metFORMIN  (GLUCOPHAGE ) 1000 MG tablet TAKE 1 TABLET TWICE A DAY WITH MEALS   [DISCONTINUED] amLODipine  (NORVASC ) 10 MG tablet Take 1 tablet (10 mg total) by mouth daily.   [DISCONTINUED] empagliflozin  (JARDIANCE ) 25 MG TABS tablet Take 1 tablet (25 mg total) by mouth daily before breakfast.   [DISCONTINUED] lisinopril  (ZESTRIL ) 10 MG tablet Take 1 tablet (10 mg total) by mouth daily.   [DISCONTINUED] Semaglutide ,0.25 or 0.5MG /DOS, (OZEMPIC , 0.25 OR 0.5 MG/DOSE,) 2 MG/3ML SOPN Inject 0.25 mg into the skin once a week.   [DISCONTINUED] terbinafine  (LAMISIL ) 250 MG tablet Take 1 tablet (250 mg total) by mouth daily.  No facility-administered medications prior to visit.    Review of Systems  Last metabolic panel Lab Results  Component Value Date   GLUCOSE 212 (H) 07/04/2023   NA 139 07/04/2023   K 5.2 07/04/2023   CL 102 07/04/2023   CO2 24 07/04/2023   BUN 10 07/04/2023   CREATININE 1.00 07/04/2023   EGFR 92 07/04/2023   CALCIUM  9.7 07/04/2023   PROT 7.0 07/04/2023   ALBUMIN 4.7 07/04/2023   LABGLOB 2.3 07/04/2023   AGRATIO 2.2 03/23/2023   BILITOT 0.9 07/04/2023    ALKPHOS 78 07/04/2023   AST 21 07/04/2023   ALT 32 07/04/2023   Last lipids Lab Results  Component Value Date   CHOL 180 03/23/2023   HDL 79 03/23/2023   LDLCALC 79 03/23/2023   LDLDIRECT 81.0 11/12/2015   TRIG 130 03/23/2023   CHOLHDL 2.3 03/23/2023   Last hemoglobin A1c Lab Results  Component Value Date   HGBA1C 8.6 (H) 07/04/2023    Last thyroid  functions Lab Results  Component Value Date   TSH 0.30 (L) 05/22/2023        Objective    BP 118/78 Comment: 3 weeks ago from doctor's office visit  Wt 194 lb (88 kg) Comment: home measurement  BMI 27.84 kg/m   BP Readings from Last 3 Encounters:  06/12/24 118/78  07/04/23 (!) 130/99  05/29/23 (!) 125/90   Wt Readings from Last 3 Encounters:  06/12/24 194 lb (88 kg)  07/04/23 209 lb (94.8 kg)  05/29/23 199 lb 12.8 oz (90.6 kg)        Physical Exam Vitals reviewed.  Constitutional:      General: He is not in acute distress.    Appearance: Normal appearance. He is not ill-appearing.  Pulmonary:     Effort: Pulmonary effort is normal. No respiratory distress.  Neurological:     Mental Status: He is alert and oriented to person, place, and time.  Psychiatric:        Mood and Affect: Mood normal.        Behavior: Behavior normal.        Thought Content: Thought content normal.        Assessment & Plan     Problem List Items Addressed This Visit       Cardiovascular and Mediastinum   Hypertension associated with diabetes (HCC)    Chronic hypertension appears to be well controlled based on reported BP. Last recorded blood pressure was 118/78 mmHg, but home readings are inconsistent. Currently on amlodipine  10 mg and lisinopril  10 mg. 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 Currently on metformin . Unable to tolerate jardiance  due to dehydration, unable to tolerate semaglutide  due to GI upset in Aug 2024. Discontinued Ozempic  due to gastrointestinal side effects, which were impractical for his work environment. - Continue metformin  1000 mg twice daily - Discontinue Ozempic  and jardiance  due to intolerances listed above - Order HbA1c and urine albumin test      Relevant Medications  lisinopril  (ZESTRIL ) 10 MG tablet   Other Relevant Orders   Hemoglobin A1c   Microalbumin / creatinine urine ratio   Hyperlipidemia associated with type 2 diabetes mellitus (HCC)   Chronic hyperlipidemia, currently managed with atorvastatin  40 mg. Requires updated lipid panel to assess current control. - Continue atorvastatin  40 mg daily - Order updated lipid panel      Relevant Medications   amLODipine  (NORVASC ) 10 MG tablet   lisinopril  (ZESTRIL ) 10 MG tablet   Other Relevant Orders   Lipid panel     Musculoskeletal and Integument   Osteoarthritis of knee   Osteoarthritis of the Knees Severe osteoarthritis with no cartilage in knees, requiring future knee replacement surgeries. Currently managed with meloxicam . 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 Orders refills as needed, especially during dry seasons. - Continue ammonium lactate  12% cream as needed       Assessment & Plan    Follow-up Follow-up visit to assess diabetes control and other chronic conditions. - Schedule follow-up appointment for September 15, 2024, at 8:40 AM     Return in about 3 months (around 09/15/2024).     I discussed the  assessment and treatment plan with the patient. The patient was provided an opportunity to ask questions and all were answered. The patient agreed with the plan and demonstrated an understanding of the instructions.   The patient was advised to call back or seek an in-person evaluation if the symptoms worsen or if the condition fails to improve as anticipated.  I provided 20 minutes of non-face-to-face time during this encounter.   Rockie Agent, MD [facility] [phone] (phone) [phone] (fax)  [facility]

## 2024-06-12 NOTE — Assessment & Plan Note (Signed)
 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 Orders refills as needed, especially during dry seasons. - Continue ammonium lactate  12% cream as needed

## 2024-06-13 DIAGNOSIS — I152 Hypertension secondary to endocrine disorders: Secondary | ICD-10-CM | POA: Diagnosis not present

## 2024-06-13 DIAGNOSIS — E1169 Type 2 diabetes mellitus with other specified complication: Secondary | ICD-10-CM | POA: Diagnosis not present

## 2024-06-13 DIAGNOSIS — E1159 Type 2 diabetes mellitus with other circulatory complications: Secondary | ICD-10-CM | POA: Diagnosis not present

## 2024-06-13 DIAGNOSIS — E1129 Type 2 diabetes mellitus with other diabetic kidney complication: Secondary | ICD-10-CM | POA: Diagnosis not present

## 2024-06-13 DIAGNOSIS — R809 Proteinuria, unspecified: Secondary | ICD-10-CM | POA: Diagnosis not present

## 2024-06-14 LAB — BMP8+EGFR
BUN/Creatinine Ratio: 12 (ref 9–20)
BUN: 11 mg/dL (ref 6–24)
CO2: 21 mmol/L (ref 20–29)
Calcium: 9.3 mg/dL (ref 8.7–10.2)
Chloride: 102 mmol/L (ref 96–106)
Creatinine, Ser: 0.89 mg/dL (ref 0.76–1.27)
Glucose: 150 mg/dL — ABNORMAL HIGH (ref 70–99)
Potassium: 4.5 mmol/L (ref 3.5–5.2)
Sodium: 140 mmol/L (ref 134–144)
eGFR: 104 mL/min/1.73 (ref >59)

## 2024-06-14 LAB — MICROALBUMIN / CREATININE URINE RATIO
Creatinine, Urine: 160.1 mg/dL
Microalb/Creat Ratio: 25 mg/g{creat} (ref 0–29)
Microalbumin, Urine: 40.8 ug/mL

## 2024-06-14 LAB — LIPID PANEL
Chol/HDL Ratio: 2.2 ratio (ref 0.0–5.0)
Cholesterol, Total: 118 mg/dL (ref 100–199)
HDL: 53 mg/dL (ref >39)
LDL Chol Calc (NIH): 48 mg/dL (ref 0–99)
Triglycerides: 88 mg/dL (ref 0–149)
VLDL Cholesterol Cal: 17 mg/dL (ref 5–40)

## 2024-06-14 LAB — HEMOGLOBIN A1C
Est. average glucose Bld gHb Est-mCnc: 232 mg/dL
Hgb A1c MFr Bld: 9.7 % — ABNORMAL HIGH (ref 4.8–5.6)

## 2024-06-16 ENCOUNTER — Telehealth: Payer: Self-pay

## 2024-06-16 NOTE — Telephone Encounter (Signed)
 Patient was identified as falling into the [place] Measure - Diabetes.   Patient was: Attribution and/or data issue.  Validation/Investigation needed.  Explanation:  Patient never seen at [facility]. PCP is [facility].

## 2024-06-18 ENCOUNTER — Ambulatory Visit: Payer: Self-pay | Admitting: Family Medicine

## 2024-06-18 ENCOUNTER — Other Ambulatory Visit: Payer: Self-pay | Admitting: Family Medicine

## 2024-06-18 ENCOUNTER — Other Ambulatory Visit: Payer: Self-pay

## 2024-06-18 DIAGNOSIS — E1129 Type 2 diabetes mellitus with other diabetic kidney complication: Secondary | ICD-10-CM

## 2024-06-18 MED ORDER — SITAGLIPTIN PHOSPHATE 25 MG PO TABS: 25.0000 mg | ORAL_TABLET | Freq: Every day | ORAL | 3 refills | Status: AC

## 2024-08-27 ENCOUNTER — Other Ambulatory Visit: Payer: Self-pay | Admitting: Medical Genetics

## 2024-09-15 ENCOUNTER — Other Ambulatory Visit

## 2024-09-15 ENCOUNTER — Ambulatory Visit (INDEPENDENT_AMBULATORY_CARE_PROVIDER_SITE_OTHER): Admitting: Family Medicine

## 2024-09-15 VITALS — BP 110/79 | HR 90 | Resp 16 | Ht 70.0 in | Wt 203.0 lb

## 2024-09-15 DIAGNOSIS — E0590 Thyrotoxicosis, unspecified without thyrotoxic crisis or storm: Secondary | ICD-10-CM | POA: Diagnosis not present

## 2024-09-15 DIAGNOSIS — E1169 Type 2 diabetes mellitus with other specified complication: Secondary | ICD-10-CM

## 2024-09-15 DIAGNOSIS — Q800 Ichthyosis vulgaris: Secondary | ICD-10-CM | POA: Diagnosis not present

## 2024-09-15 DIAGNOSIS — R809 Proteinuria, unspecified: Secondary | ICD-10-CM | POA: Diagnosis not present

## 2024-09-15 DIAGNOSIS — E785 Hyperlipidemia, unspecified: Secondary | ICD-10-CM

## 2024-09-15 DIAGNOSIS — E1159 Type 2 diabetes mellitus with other circulatory complications: Secondary | ICD-10-CM

## 2024-09-15 DIAGNOSIS — Z7984 Long term (current) use of oral hypoglycemic drugs: Secondary | ICD-10-CM

## 2024-09-15 DIAGNOSIS — Z23 Encounter for immunization: Secondary | ICD-10-CM

## 2024-09-15 DIAGNOSIS — M170 Bilateral primary osteoarthritis of knee: Secondary | ICD-10-CM

## 2024-09-15 DIAGNOSIS — E1129 Type 2 diabetes mellitus with other diabetic kidney complication: Secondary | ICD-10-CM

## 2024-09-15 DIAGNOSIS — L608 Other nail disorders: Secondary | ICD-10-CM

## 2024-09-15 DIAGNOSIS — I152 Hypertension secondary to endocrine disorders: Secondary | ICD-10-CM

## 2024-09-15 NOTE — Assessment & Plan Note (Signed)
 Chronic condition  LDL level is 48, which is within the normal range. ASCVD risk score is unable to be calculated. - Continue Lipitor 40mg  daily  - Check cholesterol levels during fasting lab work.

## 2024-09-15 NOTE — Assessment & Plan Note (Signed)
 Chronic condition  Will check TSH and T4 levels today

## 2024-09-15 NOTE — Assessment & Plan Note (Signed)
 Noted on diabetes foot exam today  Has previously been evaluated by podiatry, no fungal disease reported  Patient DM foot exam completed today

## 2024-09-15 NOTE — Assessment & Plan Note (Signed)
 Type 2 diabetes mellitus Chronic condition  Last hemoglobin A1c was 9.7 three months ago. Goal is less than 7. He is compliant with metformin  and Januvia  regimen. Discussed potential benefits of Ozempic  and Mounjaro for weight management and A1c control, but he has concerns about side effects and accessibility. - Continue metformin  1000 mg twice daily. - Continue Januvia  25 mg daily. - Order updated A1c. - Check vitamin B12 levels due to long-term metformin  use. - Check thyroid  levels (TSH, T4). - Check liver, kidney, and electrolyte levels during fasting lab work.

## 2024-09-15 NOTE — Assessment & Plan Note (Signed)
 Chronic condition  Blood pressure is well controlled at 110/79. - Continue amlodipine  2 mg daily. - Continue lisinopril  10 mg daily.

## 2024-09-15 NOTE — Addendum Note (Signed)
 Addended by: MARYLEN ODELLA CROME on: 09/15/2024 03:19 PM   Modules accepted: Orders

## 2024-09-15 NOTE — Assessment & Plan Note (Signed)
 Chronic condition  He has no cartilage in knees, resulting in bone-on-bone contact. Currently managed with meloxicam  and steroid injections. No recent injections needed, indicating good control. - Continue meloxicam  15 mg daily.

## 2024-09-15 NOTE — Assessment & Plan Note (Signed)
 Chronic condition  Condition managed with meloxicam  and topical lotions. - Continue current topical lotions for skin and feet.

## 2024-09-15 NOTE — Patient Instructions (Signed)
 To keep you healthy, please keep in mind the following health maintenance items that you are due for:   Health Maintenance Due  Topic Date Due   Hepatitis B Vaccines 19-59 Average Risk (1 of 3 - 19+ 3-dose series) Never done   Pneumococcal Vaccine: 50+ Years (2 of 2 - PCV) 11/04/2020   OPHTHALMOLOGY EXAM  01/06/2022   DTaP/Tdap/Td (2 - Td or Tdap) 11/27/2022   Zoster Vaccines- Shingrix (1 of 2) Never done   FOOT EXAM  05/25/2023   Influenza Vaccine  06/20/2024   COVID-19 Vaccine (6 - 2025-26 season) 07/21/2024     Best Wishes,   Dr. Lang

## 2024-09-15 NOTE — Progress Notes (Signed)
 Established patient visit   Patient: Bradley Carroll   DOB: 12-Feb-1971   53 y.o. Male  MRN: [id number] Visit Date: 09/15/2024  Today's healthcare provider: Rockie Agent, MD   Chief Complaint  Patient presents with   Follow-up    F/u   Subjective     HPI     Follow-up    Additional comments: F/u      Last edited by Marylen Odella CROME, CMA on 09/15/2024  8:32 AM.       Discussed the use of AI scribe software for clinical note transcription with the patient, who gave verbal consent to proceed.  History of Present Illness Bradley Carroll is a 53 year old male with hyperlipidemia, hypertension, and type 2 diabetes who presents for chronic follow-up.  He is on a regimen of amlodipine  2 mg daily, lisinopril  10 mg daily, metformin  1000 mg twice daily, and Januvia  25 mg daily. His last hemoglobin A1c was 9.7 three months ago, with a target of less than 7. He fasted before the visit, consuming only coffee with cream. He has not used Ozempic  due to insurance issues and concerns about side effects experienced by a coworker.  He maintains a BMI of 29 and engages in significant physical activity, walking between 15,000 and 18,000 steps daily at his workplace, which spans a million square feet. He describes his diet as healthy.  He has a history of hyperlipidemia and continues on Lipitor. His LDL was 48, which is within the normal range. There have been no recent changes in his medication regimen.  He has ichthyosis vulgaris and uses meloxicam  15 mg daily for joint pain. He received steroid injections in both knees several months ago and reports no current knee pain, with no need for further injections recently.  He had an eye exam earlier this year and received new glasses. He uses a specific lotion for his skin and another for his feet, which he applies regularly, although he missed applying it this morning due to being in a rush.     Past Medical History:  Diagnosis  Date   Allergy    Asthma    Osteoarthritis    knees   Pre-diabetes     Medications: Outpatient Medications Prior to Visit  Medication Sig   amLODipine  (NORVASC ) 10 MG tablet Take 1 tablet (10 mg total) by mouth daily.   ammonium lactate  (AMLACTIN) 12 % cream APPLY TOPICALLY AS NEEDED FOR DRY SKIN   atorvastatin  (LIPITOR) 40 MG tablet TAKE 1 TABLET DAILY   lisinopril  (ZESTRIL ) 10 MG tablet Take 1 tablet (10 mg total) by mouth daily.   meloxicam  (MOBIC ) 15 MG tablet Take 1 tablet by mouth daily.   metFORMIN  (GLUCOPHAGE ) 1000 MG tablet TAKE 1 TABLET TWICE A DAY WITH MEALS   sitaGLIPtin  (JANUVIA ) 25 MG tablet Take 1 tablet (25 mg total) by mouth daily.   [DISCONTINUED] Insulin Pen Needle (BD PEN NEEDLE NANO 2ND GEN) 32G X 4 MM MISC 1 each by Does not apply route once a week.   No facility-administered medications prior to visit.    Review of Systems  Last CBC Lab Results  Component Value Date   WBC 4.6 03/23/2023   HGB 16.3 03/23/2023   HCT 47.9 03/23/2023   MCV 91 03/23/2023   MCH 30.9 03/23/2023   RDW 12.8 03/23/2023   PLT 212 03/23/2023   Last metabolic panel Lab Results  Component Value Date   GLUCOSE 150 (H) 06/13/2024  NA 140 06/13/2024   K 4.5 06/13/2024   CL 102 06/13/2024   CO2 21 06/13/2024   BUN 11 06/13/2024   CREATININE 0.89 06/13/2024   EGFR 104 06/13/2024   CALCIUM  9.3 06/13/2024   PROT 7.0 07/04/2023   ALBUMIN 4.7 07/04/2023   LABGLOB 2.3 07/04/2023   AGRATIO 2.2 03/23/2023   BILITOT 0.9 07/04/2023   ALKPHOS 78 07/04/2023   AST 21 07/04/2023   ALT 32 07/04/2023   Last lipids Lab Results  Component Value Date   CHOL 118 06/13/2024   HDL 53 06/13/2024   LDLCALC 48 06/13/2024   LDLDIRECT 81.0 11/12/2015   TRIG 88 06/13/2024   CHOLHDL 2.2 06/13/2024  The ASCVD Risk score (Arnett DK, et al., 2019) failed to calculate for the following reasons:   The valid total cholesterol range is 130 to 320 mg/dL  Last hemoglobin J8r Lab Results   Component Value Date   HGBA1C 9.7 (H) 06/13/2024        Objective    BP 110/79 (BP Location: Right Arm, Patient Position: Sitting)   Pulse 90   Resp 16   Ht 5' 10 (1.778 m)   Wt 203 lb (92.1 kg)   SpO2 100%   BMI 29.13 kg/m  BP Readings from Last 3 Encounters:  09/15/24 110/79  06/12/24 118/78  07/04/23 (!) 130/99   Wt Readings from Last 3 Encounters:  09/15/24 203 lb (92.1 kg)  06/12/24 194 lb (88 kg)  07/04/23 209 lb (94.8 kg)        Physical Exam Vitals reviewed.  Constitutional:      General: He is not in acute distress.    Appearance: Normal appearance. He is not ill-appearing.  Cardiovascular:     Rate and Rhythm: Normal rate and regular rhythm.     Pulses:          Dorsalis pedis pulses are 2+ on the right side and 2+ on the left side.       Posterior tibial pulses are 2+ on the right side and 2+ on the left side.  Pulmonary:     Effort: Pulmonary effort is normal. No respiratory distress.     Breath sounds: No wheezing, rhonchi or rales.  Musculoskeletal:     Right foot: Normal range of motion.     Left foot: Normal range of motion.  Feet:     Right foot:     Protective Sensation: 6 sites tested.  6 sites sensed.     Skin integrity: Dry skin present. No ulcer or fissure.     Left foot:     Protective Sensation: 6 sites tested.  6 sites sensed.     Skin integrity: Dry skin present. No ulcer or fissure.     Comments: Hyperpigmented toenails bilaterally  Neurological:     Mental Status: He is alert and oriented to person, place, and time.  Psychiatric:        Mood and Affect: Mood normal.        Behavior: Behavior normal.       No results found for any visits on 09/15/24.  Assessment & Plan     Problem List Items Addressed This Visit     Hyperlipidemia associated with type 2 diabetes mellitus (HCC)   Chronic condition  LDL level is 48, which is within the normal range. ASCVD risk score is unable to be calculated. - Continue Lipitor 40mg   daily  - Check cholesterol levels during fasting lab work.  Relevant Orders   Lipid panel   Hypertension associated with diabetes (HCC) - Primary   Chronic condition  Blood pressure is well controlled at 110/79. - Continue amlodipine  2 mg daily. - Continue lisinopril  10 mg daily.      Relevant Orders   CMP14+EGFR   Hyperthyroidism   Chronic condition  Will check TSH and T4 levels today       Relevant Orders   TSH + free T4   Ichthyosis vulgaris   Chronic condition  Condition managed with meloxicam  and topical lotions. - Continue current topical lotions for skin and feet.      Osteoarthritis of knee   Chronic condition  He has no cartilage in knees, resulting in bone-on-bone contact. Currently managed with meloxicam  and steroid injections. No recent injections needed, indicating good control. - Continue meloxicam  15 mg daily.      Toenail deformity   Noted on diabetes foot exam today  Has previously been evaluated by podiatry, no fungal disease reported  Patient DM foot exam completed today       Type 2 diabetes mellitus with microalbuminuria, without long-term current use of insulin (HCC)   Type 2 diabetes mellitus Chronic condition  Last hemoglobin A1c was 9.7 three months ago. Goal is less than 7. He is compliant with metformin  and Januvia  regimen. Discussed potential benefits of Ozempic  and Mounjaro for weight management and A1c control, but he has concerns about side effects and accessibility. - Continue metformin  1000 mg twice daily. - Continue Januvia  25 mg daily. - Order updated A1c. - Check vitamin B12 levels due to long-term metformin  use. - Check thyroid  levels (TSH, T4). - Check liver, kidney, and electrolyte levels during fasting lab work.      Relevant Orders   Vitamin B12   Hemoglobin A1c     Assessment & Plan     General Health Maintenance Recommended to receive pneumococcal vaccine, tetanus booster, and flu vaccine. Flu vaccine  already administered. COVID vaccine not available at the clinic. - Recommend  pneumococcal vaccine. - Recommended  tetanus booster. - Administered flu vaccine today  - Request records from recent eye exam at [facility]. - Performed foot exam.     Return in about 3 months (around 12/16/2024) for DM, HTN, Cholesterol.         Rockie Agent, MD  [facility] [phone] (phone) [phone] (fax)  [facility]

## 2024-09-16 LAB — VITAMIN B12: Vitamin B-12: 815 pg/mL (ref 232–1245)

## 2024-09-16 LAB — CMP14+EGFR
ALT: 30 IU/L (ref 0–44)
AST: 31 IU/L (ref 0–40)
Albumin: 4.9 g/dL (ref 3.8–4.9)
Alkaline Phosphatase: 67 IU/L (ref 47–123)
BUN/Creatinine Ratio: 11 (ref 9–20)
BUN: 10 mg/dL (ref 6–24)
Bilirubin Total: 1 mg/dL (ref 0.0–1.2)
CO2: 22 mmol/L (ref 20–29)
Calcium: 9.8 mg/dL (ref 8.7–10.2)
Chloride: 99 mmol/L (ref 96–106)
Creatinine, Ser: 0.88 mg/dL (ref 0.76–1.27)
Globulin, Total: 2.2 g/dL (ref 1.5–4.5)
Glucose: 157 mg/dL — ABNORMAL HIGH (ref 70–99)
Potassium: 5.2 mmol/L (ref 3.5–5.2)
Sodium: 139 mmol/L (ref 134–144)
Total Protein: 7.1 g/dL (ref 6.0–8.5)
eGFR: 104 mL/min/1.73 (ref >59)

## 2024-09-16 LAB — LIPID PANEL
Chol/HDL Ratio: 2 ratio (ref 0.0–5.0)
Cholesterol, Total: 133 mg/dL (ref 100–199)
HDL: 67 mg/dL (ref >39)
LDL Chol Calc (NIH): 51 mg/dL (ref 0–99)
Triglycerides: 79 mg/dL (ref 0–149)
VLDL Cholesterol Cal: 15 mg/dL (ref 5–40)

## 2024-09-16 LAB — HEMOGLOBIN A1C
Est. average glucose Bld gHb Est-mCnc: 203 mg/dL
Hgb A1c MFr Bld: 8.7 % — ABNORMAL HIGH (ref 4.8–5.6)

## 2024-09-16 LAB — TSH+FREE T4
Free T4: 1.26 ng/dL (ref 0.82–1.77)
TSH: 0.401 u[IU]/mL — ABNORMAL LOW (ref 0.450–4.500)

## 2024-09-21 ENCOUNTER — Ambulatory Visit: Payer: Self-pay | Admitting: Family Medicine

## 2024-09-21 DIAGNOSIS — R7989 Other specified abnormal findings of blood chemistry: Secondary | ICD-10-CM

## 2024-12-01 ENCOUNTER — Encounter: Payer: Self-pay | Admitting: Family Medicine

## 2024-12-01 ENCOUNTER — Other Ambulatory Visit: Payer: Self-pay

## 2024-12-01 DIAGNOSIS — Q800 Ichthyosis vulgaris: Secondary | ICD-10-CM

## 2024-12-01 MED ORDER — AMMONIUM LACTATE 12 % EX CREA: TOPICAL_CREAM | CUTANEOUS | 4 refills | Status: DC | PRN

## 2024-12-02 ENCOUNTER — Other Ambulatory Visit: Payer: Self-pay | Admitting: Family Medicine

## 2024-12-02 DIAGNOSIS — Q800 Ichthyosis vulgaris: Secondary | ICD-10-CM

## 2024-12-16 ENCOUNTER — Encounter: Payer: Self-pay | Admitting: Family Medicine

## 2024-12-17 ENCOUNTER — Ambulatory Visit: Admitting: Family Medicine

## 2025-03-09 ENCOUNTER — Ambulatory Visit: Admitting: Family Medicine
